# Patient Record
Sex: Female | Born: 1965 | Race: Black or African American | Hispanic: No | Marital: Single | Smoking: Former smoker
Health system: Southern US, Community
[De-identification: ages and names within clinical notes are randomized; demographics above are authoritative.]

## PROBLEM LIST (undated history)

## (undated) DIAGNOSIS — E119 Type 2 diabetes mellitus without complications: Secondary | ICD-10-CM

## (undated) DIAGNOSIS — I10 Essential (primary) hypertension: Secondary | ICD-10-CM

## (undated) DIAGNOSIS — G4730 Sleep apnea, unspecified: Secondary | ICD-10-CM

## (undated) DIAGNOSIS — I509 Heart failure, unspecified: Secondary | ICD-10-CM

## (undated) HISTORY — PX: OTHER SURGICAL HISTORY: SHX169

---

## 2016-11-05 ENCOUNTER — Other Ambulatory Visit
Admission: RE | Admit: 2016-11-05 | Discharge: 2016-11-05 | Disposition: A | Discharge status: Home / Self Care | Payer: Medicaid Other | Source: Ambulatory Visit | Attending: Student | Admitting: Student

## 2016-11-05 DIAGNOSIS — R159 Full incontinence of feces: Secondary | ICD-10-CM | POA: Insufficient documentation

## 2016-11-05 LAB — GASTROINTESTINAL PANEL BY PCR, STOOL (REPLACES STOOL CULTURE)

## 2016-12-09 ENCOUNTER — Other Ambulatory Visit
Admission: RE | Admit: 2016-12-09 | Discharge: 2016-12-09 | Disposition: A | Discharge status: Home / Self Care | Payer: Medicaid Other | Source: Ambulatory Visit | Attending: Student | Admitting: Student

## 2016-12-09 DIAGNOSIS — R152 Fecal urgency: Secondary | ICD-10-CM | POA: Diagnosis present

## 2016-12-09 DIAGNOSIS — R159 Full incontinence of feces: Secondary | ICD-10-CM | POA: Diagnosis present

## 2016-12-09 LAB — C DIFFICILE QUICK SCREEN W PCR REFLEX
C DIFFICILE (CDIFF) TOXIN: NEGATIVE
C DIFFICLE (CDIFF) ANTIGEN: NEGATIVE
C Diff interpretation: NOT DETECTED

## 2016-12-11 LAB — PANCREATIC ELASTASE, FECAL: PANCREATIC ELASTASE-1, STL: 241 ug Elast./g (ref >200)

## 2017-02-11 ENCOUNTER — Ambulatory Visit: Payer: Medicaid Other | Admitting: Physical Therapy

## 2017-12-23 ENCOUNTER — Other Ambulatory Visit: Payer: Self-pay

## 2017-12-23 ENCOUNTER — Inpatient Hospital Stay
Admit: 2017-12-23 | Discharge: 2017-12-23 | Disposition: A | Discharge status: Home / Self Care | Payer: Medicaid Other | Attending: Internal Medicine | Admitting: Internal Medicine

## 2017-12-23 ENCOUNTER — Inpatient Hospital Stay
Admission: EM | Admit: 2017-12-23 | Discharge: 2017-12-25 | DRG: 291 | Disposition: A | Discharge status: Home / Self Care | Payer: Medicaid Other | Attending: Specialist | Admitting: Specialist

## 2017-12-23 ENCOUNTER — Emergency Department: Payer: Medicaid Other

## 2017-12-23 ENCOUNTER — Encounter: Payer: Self-pay | Admitting: Emergency Medicine

## 2017-12-23 DIAGNOSIS — I5043 Acute on chronic combined systolic (congestive) and diastolic (congestive) heart failure: Secondary | ICD-10-CM | POA: Diagnosis present

## 2017-12-23 DIAGNOSIS — I509 Heart failure, unspecified: Secondary | ICD-10-CM

## 2017-12-23 DIAGNOSIS — R0902 Hypoxemia: Secondary | ICD-10-CM

## 2017-12-23 DIAGNOSIS — F17220 Nicotine dependence, chewing tobacco, uncomplicated: Secondary | ICD-10-CM | POA: Diagnosis present

## 2017-12-23 DIAGNOSIS — Z6841 Body Mass Index (BMI) 40.0 and over, adult: Secondary | ICD-10-CM

## 2017-12-23 DIAGNOSIS — Z885 Allergy status to narcotic agent status: Secondary | ICD-10-CM

## 2017-12-23 DIAGNOSIS — J101 Influenza due to other identified influenza virus with other respiratory manifestations: Secondary | ICD-10-CM

## 2017-12-23 DIAGNOSIS — Z7984 Long term (current) use of oral hypoglycemic drugs: Secondary | ICD-10-CM

## 2017-12-23 DIAGNOSIS — Z91018 Allergy to other foods: Secondary | ICD-10-CM

## 2017-12-23 DIAGNOSIS — Z880 Allergy status to penicillin: Secondary | ICD-10-CM | POA: Diagnosis not present

## 2017-12-23 DIAGNOSIS — G4730 Sleep apnea, unspecified: Secondary | ICD-10-CM | POA: Diagnosis present

## 2017-12-23 DIAGNOSIS — E876 Hypokalemia: Secondary | ICD-10-CM | POA: Diagnosis present

## 2017-12-23 DIAGNOSIS — Z79899 Other long term (current) drug therapy: Secondary | ICD-10-CM | POA: Diagnosis not present

## 2017-12-23 DIAGNOSIS — Z7982 Long term (current) use of aspirin: Secondary | ICD-10-CM | POA: Diagnosis not present

## 2017-12-23 DIAGNOSIS — Z888 Allergy status to other drugs, medicaments and biological substances status: Secondary | ICD-10-CM

## 2017-12-23 DIAGNOSIS — J9601 Acute respiratory failure with hypoxia: Secondary | ICD-10-CM | POA: Diagnosis present

## 2017-12-23 DIAGNOSIS — E1140 Type 2 diabetes mellitus with diabetic neuropathy, unspecified: Secondary | ICD-10-CM | POA: Diagnosis present

## 2017-12-23 DIAGNOSIS — F329 Major depressive disorder, single episode, unspecified: Secondary | ICD-10-CM | POA: Diagnosis present

## 2017-12-23 DIAGNOSIS — J1000 Influenza due to other identified influenza virus with unspecified type of pneumonia: Secondary | ICD-10-CM | POA: Diagnosis present

## 2017-12-23 DIAGNOSIS — I110 Hypertensive heart disease with heart failure: Principal | ICD-10-CM | POA: Diagnosis present

## 2017-12-23 HISTORY — DX: Sleep apnea, unspecified: G47.30

## 2017-12-23 HISTORY — DX: Heart failure, unspecified: I50.9

## 2017-12-23 HISTORY — DX: Essential (primary) hypertension: I10

## 2017-12-23 HISTORY — DX: Morbid (severe) obesity due to excess calories: E66.01

## 2017-12-23 HISTORY — DX: Type 2 diabetes mellitus without complications: E11.9

## 2017-12-23 LAB — CBC WITH DIFFERENTIAL/PLATELET
BASOS ABS: 0 10*3/uL (ref 0–0.1)
Basophils Relative: 0 %
Eosinophils Absolute: 0 10*3/uL (ref 0–0.7)
Eosinophils Relative: 0 %
HEMATOCRIT: 37.7 % (ref 35.0–47.0)
HEMOGLOBIN: 12.2 g/dL (ref 12.0–16.0)
Lymphocytes Relative: 9 %
Lymphs Abs: 0.6 10*3/uL — ABNORMAL LOW (ref 1.0–3.6)
MCH: 27.3 pg (ref 26.0–34.0)
MCHC: 32.3 g/dL (ref 32.0–36.0)
MCV: 84.5 fL (ref 80.0–100.0)
Monocytes Absolute: 0.6 10*3/uL (ref 0.2–0.9)
Monocytes Relative: 8 %
NEUTROS ABS: 6.1 10*3/uL (ref 1.4–6.5)
NEUTROS PCT: 83 %
Platelets: 310 10*3/uL (ref 150–440)
RBC: 4.46 MIL/uL (ref 3.80–5.20)
RDW: 13.6 % (ref 11.5–14.5)
WBC: 7.4 10*3/uL (ref 3.6–11.0)

## 2017-12-23 LAB — BASIC METABOLIC PANEL
ANION GAP: 10 (ref 5–15)
BUN: 11 mg/dL (ref 6–20)
CHLORIDE: 99 mmol/L — AB (ref 101–111)
CO2: 29 mmol/L (ref 22–32)
Calcium: 8.3 mg/dL — ABNORMAL LOW (ref 8.9–10.3)
Creatinine, Ser: 0.85 mg/dL (ref 0.44–1.00)
GFR calc non Af Amer: >60 mL/min (ref >60)
Glucose, Bld: 149 mg/dL — ABNORMAL HIGH (ref 65–99)
Potassium: 3.1 mmol/L — ABNORMAL LOW (ref 3.5–5.1)
Sodium: 138 mmol/L (ref 135–145)

## 2017-12-23 LAB — GLUCOSE, CAPILLARY
Glucose-Capillary: 115 mg/dL — ABNORMAL HIGH (ref 65–99)
Glucose-Capillary: 105 mg/dL — ABNORMAL HIGH (ref 65–99)

## 2017-12-23 LAB — TROPONIN I

## 2017-12-23 LAB — LACTIC ACID, PLASMA
LACTIC ACID, VENOUS: 1.1 mmol/L (ref 0.5–1.9)
Lactic Acid, Venous: 1.4 mmol/L (ref 0.5–1.9)

## 2017-12-23 LAB — INFLUENZA PANEL BY PCR (TYPE A & B)
INFLAPCR: POSITIVE — AB
Influenza B By PCR: NEGATIVE

## 2017-12-23 LAB — BRAIN NATRIURETIC PEPTIDE: B NATRIURETIC PEPTIDE 5: 116 pg/mL — AB (ref 0.0–100.0)

## 2017-12-23 MED ORDER — TORSEMIDE 20 MG PO TABS
10.0000 mg | ORAL_TABLET | Freq: Every day | ORAL | Status: DC
  Administered 2017-12-23 – 2017-12-24 (×2): 10 mg via ORAL
  Filled 2017-12-23 (×2): qty 1

## 2017-12-23 MED ORDER — METFORMIN HCL 500 MG PO TABS
500.0000 mg | ORAL_TABLET | Freq: Two times a day (BID) | ORAL | Status: DC
  Administered 2017-12-23 – 2017-12-25 (×4): 500 mg via ORAL
  Filled 2017-12-23 (×4): qty 1

## 2017-12-23 MED ORDER — OSELTAMIVIR PHOSPHATE 75 MG PO CAPS
75.0000 mg | ORAL_CAPSULE | Freq: Two times a day (BID) | ORAL | Status: DC
  Administered 2017-12-23 – 2017-12-25 (×4): 75 mg via ORAL
  Filled 2017-12-23 (×5): qty 1

## 2017-12-23 MED ORDER — SODIUM CHLORIDE 0.9 % IV SOLN: 250.0000 mL | INTRAVENOUS | Status: DC | PRN

## 2017-12-23 MED ORDER — OMEGA-3-ACID ETHYL ESTERS 1 G PO CAPS
2.0000 g | ORAL_CAPSULE | Freq: Two times a day (BID) | ORAL | Status: DC
  Administered 2017-12-23 – 2017-12-25 (×4): 2 g via ORAL
  Filled 2017-12-23 (×4): qty 2

## 2017-12-23 MED ORDER — OSELTAMIVIR PHOSPHATE 75 MG PO CAPS
75.0000 mg | ORAL_CAPSULE | Freq: Once | ORAL | Status: AC
  Administered 2017-12-23: 75 mg via ORAL
  Filled 2017-12-23: qty 1

## 2017-12-23 MED ORDER — IPRATROPIUM-ALBUTEROL 0.5-2.5 (3) MG/3ML IN SOLN: 3.0000 mL | RESPIRATORY_TRACT | Status: DC | PRN

## 2017-12-23 MED ORDER — IBUPROFEN 400 MG PO TABS
400.0000 mg | ORAL_TABLET | Freq: Four times a day (QID) | ORAL | Status: DC | PRN
  Administered 2017-12-23: 400 mg via ORAL
  Filled 2017-12-23: qty 1

## 2017-12-23 MED ORDER — POTASSIUM CHLORIDE CRYS ER 20 MEQ PO TBCR
40.0000 meq | EXTENDED_RELEASE_TABLET | Freq: Once | ORAL | Status: AC
  Administered 2017-12-23: 40 meq via ORAL
  Filled 2017-12-23: qty 2

## 2017-12-23 MED ORDER — HYDROCODONE-ACETAMINOPHEN 5-325 MG PO TABS
1.0000 | ORAL_TABLET | ORAL | Status: DC | PRN
  Administered 2017-12-23 – 2017-12-24 (×3): 2 via ORAL
  Filled 2017-12-23 (×4): qty 2

## 2017-12-23 MED ORDER — MAGNESIUM OXIDE 400 (241.3 MG) MG PO TABS
400.0000 mg | ORAL_TABLET | Freq: Two times a day (BID) | ORAL | Status: DC
  Administered 2017-12-23 – 2017-12-25 (×5): 400 mg via ORAL
  Filled 2017-12-23 (×10): qty 1

## 2017-12-23 MED ORDER — IPRATROPIUM-ALBUTEROL 0.5-2.5 (3) MG/3ML IN SOLN
3.0000 mL | RESPIRATORY_TRACT | Status: DC
  Administered 2017-12-23 (×2): 3 mL via RESPIRATORY_TRACT
  Filled 2017-12-23 (×2): qty 3

## 2017-12-23 MED ORDER — IBUPROFEN 100 MG PO CHEW: 200.0000 mg | CHEWABLE_TABLET | Freq: Four times a day (QID) | ORAL | Status: DC | PRN

## 2017-12-23 MED ORDER — GUAIFENESIN ER 600 MG PO TB12
600.0000 mg | ORAL_TABLET | Freq: Two times a day (BID) | ORAL | Status: DC
  Administered 2017-12-23 – 2017-12-24 (×4): 600 mg via ORAL
  Filled 2017-12-23 (×7): qty 1

## 2017-12-23 MED ORDER — IPRATROPIUM-ALBUTEROL 0.5-2.5 (3) MG/3ML IN SOLN
3.0000 mL | Freq: Four times a day (QID) | RESPIRATORY_TRACT | Status: DC
  Administered 2017-12-24 – 2017-12-25 (×5): 3 mL via RESPIRATORY_TRACT
  Filled 2017-12-23 (×5): qty 3

## 2017-12-23 MED ORDER — LISINOPRIL 10 MG PO TABS
20.0000 mg | ORAL_TABLET | Freq: Every day | ORAL | Status: DC
  Administered 2017-12-24 – 2017-12-25 (×2): 20 mg via ORAL
  Filled 2017-12-23 (×3): qty 2

## 2017-12-23 MED ORDER — ASPIRIN EC 81 MG PO TBEC
81.0000 mg | DELAYED_RELEASE_TABLET | Freq: Every day | ORAL | Status: DC
  Administered 2017-12-24: 81 mg via ORAL
  Filled 2017-12-23 (×2): qty 1

## 2017-12-23 MED ORDER — SODIUM CHLORIDE 0.9% FLUSH: 3.0000 mL | INTRAVENOUS | Status: DC | PRN

## 2017-12-23 MED ORDER — ACETAMINOPHEN 325 MG PO TABS: 650.0000 mg | ORAL_TABLET | Freq: Four times a day (QID) | ORAL | Status: DC | PRN

## 2017-12-23 MED ORDER — LORATADINE 10 MG PO TABS
10.0000 mg | ORAL_TABLET | Freq: Every day | ORAL | Status: DC
  Administered 2017-12-23 – 2017-12-25 (×3): 10 mg via ORAL
  Filled 2017-12-23 (×3): qty 1

## 2017-12-23 MED ORDER — SODIUM CHLORIDE 0.9% FLUSH
3.0000 mL | Freq: Two times a day (BID) | INTRAVENOUS | Status: DC
  Administered 2017-12-23 – 2017-12-24 (×4): 3 mL via INTRAVENOUS

## 2017-12-23 MED ORDER — FUROSEMIDE 10 MG/ML IJ SOLN
20.0000 mg | Freq: Two times a day (BID) | INTRAMUSCULAR | Status: DC
  Administered 2017-12-23 – 2017-12-24 (×2): 20 mg via INTRAVENOUS
  Filled 2017-12-23 (×2): qty 2

## 2017-12-23 MED ORDER — SERTRALINE HCL 50 MG PO TABS
100.0000 mg | ORAL_TABLET | Freq: Every day | ORAL | Status: DC
  Administered 2017-12-24 – 2017-12-25 (×2): 100 mg via ORAL
  Filled 2017-12-23 (×3): qty 2

## 2017-12-23 MED ORDER — INSULIN ASPART 100 UNIT/ML ~~LOC~~ SOLN: 0.0000 [IU] | Freq: Three times a day (TID) | SUBCUTANEOUS | Status: DC

## 2017-12-23 MED ORDER — ENOXAPARIN SODIUM 40 MG/0.4ML ~~LOC~~ SOLN
40.0000 mg | SUBCUTANEOUS | Status: DC
  Administered 2017-12-23 – 2017-12-24 (×2): 40 mg via SUBCUTANEOUS
  Filled 2017-12-23 (×2): qty 0.4

## 2017-12-23 MED ORDER — METOPROLOL SUCCINATE ER 50 MG PO TB24
25.0000 mg | ORAL_TABLET | Freq: Every day | ORAL | Status: DC
  Administered 2017-12-23 – 2017-12-25 (×3): 25 mg via ORAL
  Filled 2017-12-23 (×3): qty 1

## 2017-12-23 MED ORDER — GABAPENTIN 300 MG PO CAPS
300.0000 mg | ORAL_CAPSULE | Freq: Three times a day (TID) | ORAL | Status: DC
  Administered 2017-12-23 – 2017-12-25 (×5): 300 mg via ORAL
  Filled 2017-12-23 (×5): qty 1

## 2017-12-23 MED ORDER — ONDANSETRON HCL 4 MG/2ML IJ SOLN: 4.0000 mg | Freq: Four times a day (QID) | INTRAMUSCULAR | Status: DC | PRN

## 2017-12-23 MED ORDER — ACETAMINOPHEN 500 MG PO TABS
1000.0000 mg | ORAL_TABLET | Freq: Once | ORAL | Status: AC
  Administered 2017-12-23: 1000 mg via ORAL
  Filled 2017-12-23: qty 2

## 2017-12-23 MED ORDER — NICOTINE 21 MG/24HR TD PT24
21.0000 mg | MEDICATED_PATCH | Freq: Every day | TRANSDERMAL | Status: DC
  Administered 2017-12-24: 21 mg via TRANSDERMAL
  Filled 2017-12-23 (×2): qty 1

## 2017-12-23 MED ORDER — ACETAMINOPHEN 650 MG RE SUPP: 650.0000 mg | Freq: Four times a day (QID) | RECTAL | Status: DC | PRN

## 2017-12-23 MED ORDER — GUAIFENESIN-DM 100-10 MG/5ML PO SYRP
5.0000 mL | ORAL_SOLUTION | ORAL | Status: DC | PRN
  Administered 2017-12-24 (×2): 5 mL via ORAL
  Filled 2017-12-23 (×3): qty 5

## 2017-12-23 MED ORDER — BUMETANIDE 1 MG PO TABS
1.0000 mg | ORAL_TABLET | Freq: Every day | ORAL | Status: DC
  Administered 2017-12-24: 1 mg via ORAL
  Filled 2017-12-23 (×2): qty 1

## 2017-12-23 MED ORDER — ONDANSETRON HCL 4 MG PO TABS: 4.0000 mg | ORAL_TABLET | Freq: Four times a day (QID) | ORAL | Status: DC | PRN

## 2017-12-23 NOTE — Plan of Care (Signed)
Patient is having night sweats. Patient educated about isolation precautions.

## 2017-12-23 NOTE — ED Triage Notes (Signed)
Ems pt from [facility] for  x2 days difficulty breathing , EMS reports a low sat of 85% RA , O2 added with improved sat to 97%.

## 2017-12-23 NOTE — ED Notes (Signed)
Per Tammy RN ok to send medications to floor with pt

## 2017-12-23 NOTE — ED Provider Notes (Signed)
[facility] Emergency Department Provider Note       Time seen: ----------------------------------------- 10:54 AM on 12/23/2017 -----------------------------------------   I have reviewed the triage vital signs and the nursing notes.  HISTORY   Chief Complaint Respiratory Distress    HPI Terry Noble is a 52 y.o. female with a history of CHF who presents to the ED for hypoxia.  Patient was sent from an outpatient community clinic for hypoxia.  Patient was actually going there for a regular medical checkup today.  She reports she has been having a fever as well as having chills, body aches and cough with vomiting.  She states when she got there they found her oxygen saturations were in the 80s.  She reports having to stay in the hospital for something similar about 4 years ago.  No past medical history on file.  There are no active problems to display for this patient.   Allergies Patient has no allergy information on record.  Social History Social History   Tobacco Use  . Smoking status: Not on file  Substance Use Topics  . Alcohol use: Not on file  . Drug use: Not on file   Review of Systems Constitutional: Positive for fevers, chills, body aches Eyes: Negative for vision changes ENT:  Negative for congestion, sore throat Cardiovascular: Negative for chest pain. Respiratory: Negative for shortness of breath. Gastrointestinal: Negative for abdominal pain, positive for vomiting Musculoskeletal: Positive for diffuse myalgias Skin: Negative for rash. Neurological: Negative for headaches, focal weakness or numbness.  All systems negative/normal/unremarkable except as stated in the HPI  ____________________________________________   PHYSICAL EXAM:  VITAL SIGNS: ED Triage Vitals  Enc Vitals Group     BP      Pulse      Resp      Temp      Temp src      SpO2      Weight      Height      Head Circumference      Peak Flow      Pain  Score      Pain Loc      Pain Edu?      Excl. in GC?     Constitutional: Alert and oriented. Well appearing and in no distress. Eyes: Conjunctivae are normal. Normal extraocular movements. ENT   Head: Normocephalic and atraumatic.   Nose: No congestion/rhinnorhea.   Mouth/Throat: Mucous membranes are moist.   Neck: No stridor. Cardiovascular: Normal rate, regular rhythm. No murmurs, rubs, or gallops. Respiratory: Normal respiratory effort without tachypnea nor retractions. Breath sounds are clear and equal bilaterally.  Scattered rhonchi Gastrointestinal: Soft and nontender. Normal bowel sounds Musculoskeletal: Nontender with normal range of motion in extremities. No lower extremity tenderness nor edema. Neurologic:  Normal speech and language. No gross focal neurologic deficits are appreciated.  Skin:  Skin is warm, dry and intact. No rash noted. Psychiatric: Mood and affect are normal. Speech and behavior are normal.  ____________________________________________  ED COURSE:  As part of my medical decision making, I reviewed the following data within the electronic MEDICAL RECORD NUMBER History obtained from family if available, nursing notes, old chart and ekg, as well as notes from prior ED visits. Patient presented for flulike symptoms and hypoxia, we will assess with labs and imaging as indicated at this time.   Procedures   EKG: Interpreted by me, sinus tachycardia with a rate of 101 bpm, normal PR interval, normal QRS, normal QT. ____________________________________________  LABS (pertinent positives/negatives)  Labs Reviewed  CBC WITH DIFFERENTIAL/PLATELET - Abnormal; Notable for the following components:      Result Value   Lymphs Abs 0.6 (*)    All other components within normal limits  BASIC METABOLIC PANEL - Abnormal; Notable for the following components:   Potassium 3.1 (*)    Chloride 99 (*)    Glucose, Bld 149 (*)    Calcium 8.3 (*)    All other  components within normal limits  BRAIN NATRIURETIC PEPTIDE - Abnormal; Notable for the following components:   B Natriuretic Peptide 116.0 (*)    All other components within normal limits  INFLUENZA PANEL BY PCR (TYPE A & B) - Abnormal; Notable for the following components:   Influenza A By PCR POSITIVE (*)    All other components within normal limits  CULTURE, BLOOD (ROUTINE X 2)  CULTURE, BLOOD (ROUTINE X 2)  TROPONIN I  LACTIC ACID, PLASMA  LACTIC ACID, PLASMA    RADIOLOGY Images were viewed by me  Chest x-ray  IMPRESSION: There is no pneumonia. Mild prominence of the central pulmonary vascularity and the cardiac silhouette is likely due to the lordotic positioning. One cannot exclude low-grade CHF in the appropriate clinical setting however. ____________________________________________  DIFFERENTIAL DIAGNOSIS   Influenza, pneumonia, URI, bronchitis, CHF, PE, pneumothorax  FINAL ASSESSMENT AND PLAN  Hypoxia, influenza   Plan: Patient had presented for flulike symptoms and was noted to be hypoxic. Patient's labs did reveal influenza a positive results. Patient's imaging did not reveal any pneumonia.  She does have some evidence of CHF which she has a history of.  Off of oxygen she desaturates into the 80s.  I have started her on Tamiflu with supplemental oxygen and a dose of torsemide.  She is stable for admission at this time.   Ulice Dash, MD   Note: This note was generated in part or whole with voice recognition software. Voice recognition is usually quite accurate but there are transcription errors that can and very often do occur. I apologize for any typographical errors that were not detected and corrected.     Emily Filbert, MD 12/23/17 [phone]

## 2017-12-23 NOTE — Progress Notes (Signed)
Pt is still febrile after treatment with tylenol. MD notified. Motrin order received. I will continue to assess.

## 2017-12-23 NOTE — ED Notes (Signed)
2nd set of Blood cultures sent to lab, per Morrell Riddle ok to send with chart labels

## 2017-12-23 NOTE — H&P (Signed)
[facility] [place] [facility]   PATIENT NAME: Terry Noble    MR#:  [id number]  DATE OF BIRTH:  Jun 15, 1966  DATE OF ADMISSION:  12/23/2017  PRIMARY CARE PHYSICIAN: [facility]   REQUESTING/REFERRING PHYSICIAN: Trixie Deis MD   CHIEF COMPLAINT:   Chief Complaint  Patient presents with  . Respiratory Distress    HISTORY OF PRESENT ILLNESS: Terry Noble  is a 52 y.o. female with a known history of congestive heart failure, diabetes type 2, essential hypertension and morbid obesity and sleep apnea but uses CPAP intermittently presents with complaint of shortness of breath body aches and fever.  Patient states that she was not feeling good therefore went to see her primary care provider and was short of breath and was noted to be hypoxic therefore sent to the ER.  In the emergency room she is noted to have some congestive heart failure on chest x-ray as well as the flu.  Patient complains of fevers chills productive cough of clear sputum and body aches.  She denies any nausea vomiting or diarrhea.  She also complains of swelling of the lower extremity. PAST MEDICAL HISTORY:   Past Medical History:  Diagnosis Date  . CHF (congestive heart failure) (HCC)   . Diabetes mellitus without complication (HCC)   . Hypertension   . Morbid obesity (HCC)   . Sleep apnea     PAST SURGICAL HISTORY:  Past Surgical History:  Procedure Laterality Date  . none      SOCIAL HISTORY:  Social History   Tobacco Use  . Smoking status: Former Games developer  . Smokeless tobacco: Current User    Types: Chew  Substance Use Topics  . Alcohol use: No    Frequency: Never    FAMILY HISTORY:  Family History  Problem Relation Age of Onset  . Hypertension Mother     DRUG ALLERGIES:  Allergies  Allergen Reactions  . Cherry   . Lasix [Furosemide]   . Oxycodone   . Penicillins     REVIEW OF SYSTEMS:   CONSTITUTIONAL: Positive fever, positive fatigue or  positive weakness.  EYES: No blurred or double vision.  EARS, NOSE, AND THROAT: No tinnitus or ear pain.  RESPIRATORY: Positive cough, positive shortness of breath, positive wheezing or hemoptysis.  CARDIOVASCULAR: No chest pain, orthopnea, positive edema.  GASTROINTESTINAL: No nausea, vomiting, diarrhea or abdominal pain.  GENITOURINARY: No dysuria, hematuria.  ENDOCRINE: No polyuria, nocturia,  HEMATOLOGY: No anemia, easy bruising or bleeding SKIN: No rash or lesion. MUSCULOSKELETAL: No joint pain or arthritis.   NEUROLOGIC: No tingling, numbness, weakness.  PSYCHIATRY: No anxiety or depression.   MEDICATIONS AT HOME:  Prior to Admission medications   Medication Sig Start Date End Date Taking? Authorizing Provider  acetaminophen (TYLENOL) 500 MG tablet Take 500 mg by mouth every 6 (six) hours as needed.   Yes [provider]  aspirin EC 81 MG tablet Take 81 mg by mouth daily.   Yes [provider]  bumetanide (BUMEX) 1 MG tablet Take 1 mg by mouth daily.   Yes [provider]  EPINEPHrine (EPIPEN 2-PAK) 0.3 mg/0.3 mL IJ SOAJ injection Inject 0.3 mg into the muscle once.   Yes [provider]  gabapentin (NEURONTIN) 300 MG capsule Take 300 mg by mouth 3 (three) times daily.   Yes [provider]  hydrocortisone 2.5 % cream Apply 1 application topically 2 (two) times daily.   Yes [provider]  lisinopril (  PRINIVIL,ZESTRIL) 20 MG tablet Take 20 mg by mouth daily.   Yes [provider]  loratadine (CLARITIN) 10 MG tablet Take 10 mg by mouth daily.   Yes [provider]  magnesium oxide (MAG-OX) 400 MG tablet Take 400 mg by mouth 2 (two) times daily.   Yes [provider]  metFORMIN (GLUCOPHAGE) 500 MG tablet Take 500 mg by mouth 2 (two) times daily with a meal.   Yes [provider]  metoprolol succinate (TOPROL-XL) 25 MG 24 hr tablet Take 25 mg by mouth daily.   Yes [provider]   Omega-3 Fatty Acids (FISH OIL) 1000 MG CAPS Take 1 capsule by mouth 2 (two) times daily.   Yes [provider]  sertraline (ZOLOFT) 100 MG tablet Take 100 mg by mouth daily.   Yes [provider]      PHYSICAL EXAMINATION:   VITAL SIGNS: Blood pressure (!) 176/89, pulse (!) 107, temperature (!) 103 F (39.4 C), temperature source Axillary, resp. rate (!) 25, height 5\' 5"  (1.651 m), weight 297 lb (134.7 kg), last menstrual period 09/23/2017, SpO2 99 %.  GENERAL:  52 y.o.-year-old patient lying in the bed with no acute distress.  EYES: Pupils equal, round, reactive to light and accommodation. No scleral icterus. Extraocular muscles intact.  HEENT: Head atraumatic, normocephalic. Oropharynx and nasopharynx clear.  NECK:  Supple, no jugular venous distention. No thyroid enlargement, no tenderness.  LUNGS: Occasional crackles and some wheezing noted bilaterally CARDIOVASCULAR: S1, S2 normal. No murmurs, rubs, or gallops.  ABDOMEN: Soft, nontender, nondistended. Bowel sounds present. No organomegaly or mass.  EXTREMITIES: No pedal edema, cyanosis, or clubbing.  NEUROLOGIC: Cranial nerves II through XII are intact. Muscle strength 5/5 in all extremities. Sensation intact. Gait not checked.  PSYCHIATRIC: The patient is alert and oriented x 3.  SKIN: No obvious rash, lesion, or ulcer.   LABORATORY PANEL:   CBC Recent Labs  Lab 12/23/17 1110  WBC 7.4  HGB 12.2  HCT 37.7  PLT 310  MCV 84.5  MCH 27.3  MCHC 32.3  RDW 13.6  LYMPHSABS 0.6*  MONOABS 0.6  EOSABS 0.0  BASOSABS 0.0   ------------------------------------------------------------------------------------------------------------------  Chemistries  Recent Labs  Lab 12/23/17 1110  NA 138  K 3.1*  CL 99*  CO2 29  GLUCOSE 149*  BUN 11  CREATININE 0.85  CALCIUM 8.3*   ------------------------------------------------------------------------------------------------------------------ estimated creatinine  clearance is 110.1 mL/min (by C-G formula based on SCr of 0.85 mg/dL). ------------------------------------------------------------------------------------------------------------------ No results for input(s): TSH, T4TOTAL, T3FREE, THYROIDAB in the last 72 hours.  Invalid input(s): FREET3   Coagulation profile No results for input(s): INR, PROTIME in the last 168 hours. ------------------------------------------------------------------------------------------------------------------- No results for input(s): DDIMER in the last 72 hours. -------------------------------------------------------------------------------------------------------------------  Cardiac Enzymes Recent Labs  Lab 12/23/17 1110  TROPONINI <0.03   ------------------------------------------------------------------------------------------------------------------ Invalid input(s): POCBNP  ---------------------------------------------------------------------------------------------------------------  Urinalysis No results found for: COLORURINE, APPEARANCEUR, LABSPEC, PHURINE, GLUCOSEU, HGBUR, BILIRUBINUR, KETONESUR, PROTEINUR, UROBILINOGEN, NITRITE, LEUKOCYTESUR   RADIOLOGY: Dg Chest 2 View  Result Date: 12/23/2017 CLINICAL DATA:  Two days of nonproductive cough.  History of CHF. EXAM: CHEST  2 VIEW COMPARISON:  None FINDINGS: Today's exam is performed in a lordotic fashion. The lungs are well-expanded. There is no focal infiltrate. There is no pleural effusion. The heart is top-normal in size. The pulmonary vascularity is not engorged. There is mild multilevel degenerative disc disease of the thoracic spine. IMPRESSION: There is no pneumonia. Mild prominence of the central pulmonary vascularity and the cardiac silhouette  is likely due to the lordotic positioning. One cannot exclude low-grade CHF in the appropriate clinical setting however. Electronically Signed   By: David  [place] M.D.   On: 12/23/2017 11:43     EKG: Orders placed or performed during the hospital encounter of 12/23/17  . ED EKG  . ED EKG  . EKG 12-Lead  . EKG 12-Lead    IMPRESSION AND PLAN: Patient is a 52 year old African-American female with history of congestive heart failure presenting with shortness of breath and a flu  1.  Acute congestive heart failure type unknown Treat her with low-dose IV Lasix Obtain echocardiogram of the heart Continue Bumex  2.  Influenza a We will treat with Tamiflu Mucolytic's Use as needed nebs due to occasional wheezing  3.  Hypokalemia replace potassium  4.  Diabetes type 2 placed on sliding scale insulin continue metformin  5.  Essential hypertension continue lisinopril  6.  Neuropathy continue gabapentin  7.  Sleep apnea encourage CPAP compliance  8.  Tobacco abuse now patient's chewing tobacco recommend that she stop smoking 4 minutes spent, patient will be started on nicotine patch  9.  Miscellaneous Lovenox for DVT prophylaxis   All the records are reviewed and case discussed with ED provider. Management plans discussed with the patient, family and they are in agreement.  CODE STATUS: Code Status History    This patient does not have a recorded code status. Please follow your organizational policy for patients in this situation.       TOTAL TIME TAKING CARE OF THIS PATIENT: .    Auburn Bilberry M.D on 12/23/2017 at 1:43 PM  Between 7am to 6pm - Pager - [phone]  After 6pm go to [email or web address] - password [email or web address] [facility]  [facility]  Office  [phone]  CC: Primary care physician; [facility]

## 2017-12-24 LAB — ECHOCARDIOGRAM COMPLETE
Height: 65 in
Weight: 4752 oz

## 2017-12-24 LAB — BASIC METABOLIC PANEL
ANION GAP: 9 (ref 5–15)
BUN: 18 mg/dL (ref 6–20)
CO2: 31 mmol/L (ref 22–32)
Calcium: 7.9 mg/dL — ABNORMAL LOW (ref 8.9–10.3)
Chloride: 99 mmol/L — ABNORMAL LOW (ref 101–111)
Creatinine, Ser: 0.91 mg/dL (ref 0.44–1.00)
GFR calc non Af Amer: >60 mL/min (ref >60)
GLUCOSE: 124 mg/dL — AB (ref 65–99)
POTASSIUM: 3.3 mmol/L — AB (ref 3.5–5.1)
SODIUM: 139 mmol/L (ref 135–145)

## 2017-12-24 LAB — GLUCOSE, CAPILLARY
GLUCOSE-CAPILLARY: 98 mg/dL (ref 65–99)
GLUCOSE-CAPILLARY: 72 mg/dL (ref 65–99)
GLUCOSE-CAPILLARY: 101 mg/dL — AB (ref 65–99)
GLUCOSE-CAPILLARY: 100 mg/dL — AB (ref 65–99)

## 2017-12-24 LAB — CBC
HCT: 36.9 % (ref 35.0–47.0)
Hemoglobin: 11.7 g/dL — ABNORMAL LOW (ref 12.0–16.0)
MCH: 27 pg (ref 26.0–34.0)
MCHC: 31.7 g/dL — AB (ref 32.0–36.0)
MCV: 85.3 fL (ref 80.0–100.0)
PLATELETS: 267 10*3/uL (ref 150–440)
RBC: 4.32 MIL/uL (ref 3.80–5.20)
RDW: 14.4 % (ref 11.5–14.5)
WBC: 5.7 10*3/uL (ref 3.6–11.0)

## 2017-12-24 LAB — HIV ANTIBODY (ROUTINE TESTING W REFLEX): HIV SCREEN 4TH GENERATION: NONREACTIVE

## 2017-12-24 MED ORDER — FUROSEMIDE 10 MG/ML IJ SOLN
20.0000 mg | Freq: Two times a day (BID) | INTRAMUSCULAR | Status: DC
  Administered 2017-12-24 (×2): 20 mg via INTRAVENOUS
  Filled 2017-12-24 (×2): qty 2

## 2017-12-24 NOTE — Progress Notes (Signed)
Provided patient with "Living Better with Heart Failure" packet. Briefly reviewed definition of heart failure and signs and symptoms of an exacerbation. Reviewed importance of and reason behind checking weight daily in the AM, after using the bathroom, but before getting dressed. Discussed when to call the Dr= weight gain of >2lb overnight of 5lb in a week,  Discussed yellow zone= call MD: weight gain of >2lb overnight of 5lb in a week, increased swelling, increased SOB when lying down, chest discomfort, dizziness, increased fatigue Red Zone= call 911: struggle to breath, fainting or near fainting, significant chest pain Reviewed low sodium diet <2g/day-provided handout of recommended and not recommended foods  Fluid restriction <2L/day Reviewed how to read nutrition label Reviewed medication changes: Explained briefly why pt is on the medications (either make you feel better, live longer or keep you out of the hospital) and discussed monitoring and side effects  Discussed tobacco cessation: non-smoker, uses chewing tobacco, discussed options to quit Discussed exercise: as tolerated  Patient is very knowledgeable about her disease state and seems very motivated. Unfortunately, she has not been able to afford her medications due to lack of Medicaid. Will discuss with Care Mgmt.   Luisa Hart, PharmD Clinical Pharmacist

## 2017-12-24 NOTE — Progress Notes (Signed)
[facility] - [place] [facility]   PATIENT NAME: Terry Noble    MR#:  [id number]  DATE OF BIRTH:  [birth year]  SUBJECTIVE:   Patient here due to acute respiratory failure with hypoxia secondary to flu and also noted to be in mild CHF. Shortness of breath has improved, but on ambulation patient became quite short of breath and hypoxic. Patient admits to a cough which is nonproductive, no fever overnight.  REVIEW OF SYSTEMS:    Review of Systems  Constitutional: Negative for chills and fever.  HENT: Negative for congestion and tinnitus.   Eyes: Negative for blurred vision and double vision.  Respiratory: Positive for cough and shortness of breath. Negative for wheezing.   Cardiovascular: Negative for chest pain, orthopnea and PND.  Gastrointestinal: Negative for abdominal pain, diarrhea, nausea and vomiting.  Genitourinary: Negative for dysuria and hematuria.  Neurological: Negative for dizziness, sensory change and focal weakness.  All other systems reviewed and are negative.   Nutrition: Heart healthy Tolerating Diet: yes Tolerating PT: Ambulatory  DRUG ALLERGIES:   Allergies  Allergen Reactions  . Cherry   . Lasix [Furosemide]   . Oxycodone   . Penicillins     VITALS:  Blood pressure 128/64, pulse 83, temperature 98.2 F (36.8 C), resp. rate 18, height 5\' 5"  (1.651 m), weight 130.3 kg (287 lb 3.2 oz), last menstrual period 09/23/2017, SpO2 95 %.  PHYSICAL EXAMINATION:   Physical Exam  GENERAL:  52 y.o.-year-old obese patient lying in bed in no acute distress.  EYES: Pupils equal, round, reactive to light and accommodation. No scleral icterus. Extraocular muscles intact.  HEENT: Head atraumatic, normocephalic. Oropharynx and nasopharynx clear.  NECK:  Supple, no jugular venous distention. No thyroid enlargement, no tenderness.  LUNGS: Normal breath sounds bilaterally, no wheezing, rales, rhonchi. No use of accessory muscles of respiration.   CARDIOVASCULAR: S1, S2 normal. No murmurs, rubs, or gallops.  ABDOMEN: Soft, nontender, nondistended. Bowel sounds present. No organomegaly or mass.  EXTREMITIES: No cyanosis, clubbing, +1 edema b/l NEUROLOGIC: Cranial nerves II through XII are intact. No focal Motor or sensory deficits b/l.   PSYCHIATRIC: The patient is alert and oriented x 3.  SKIN: No obvious rash, lesion, or ulcer.    LABORATORY PANEL:   CBC Recent Labs  Lab 12/24/17 0451  WBC 5.7  HGB 11.7*  HCT 36.9  PLT 267   ------------------------------------------------------------------------------------------------------------------  Chemistries  Recent Labs  Lab 12/24/17 0451  NA 139  K 3.3*  CL 99*  CO2 31  GLUCOSE 124*  BUN 18  CREATININE 0.91  CALCIUM 7.9*   ------------------------------------------------------------------------------------------------------------------  Cardiac Enzymes Recent Labs  Lab 12/23/17 1110  TROPONINI <0.03   ------------------------------------------------------------------------------------------------------------------  RADIOLOGY:  Dg Chest 2 View  Result Date: 12/23/2017 CLINICAL DATA:  Two days of nonproductive cough.  History of CHF. EXAM: CHEST  2 VIEW COMPARISON:  None FINDINGS: Today's exam is performed in a lordotic fashion. The lungs are well-expanded. There is no focal infiltrate. There is no pleural effusion. The heart is top-normal in size. The pulmonary vascularity is not engorged. There is mild multilevel degenerative disc disease of the thoracic spine. IMPRESSION: There is no pneumonia. Mild prominence of the central pulmonary vascularity and the cardiac silhouette is likely due to the lordotic positioning. One cannot exclude low-grade CHF in the appropriate clinical setting however. Electronically Signed   By: David  12/25/2017 M.D.   On: 12/23/2017 11:43     ASSESSMENT AND PLAN:   52 year old female with  past medical history of diabetes, history of CHF,  sleep apnea, essential hypertension who presented to the hospital due to shortness of breath noted to be hypoxic.  1. Acute respiratory failure with hypoxia-secondary to flu combined with underlying mild CHF. -Continue O2 supplementation, continue diuresis with IV Lasix, continue Tamiflu. Patient likely will need home oxygen upon discharge.  2. CHF-unclear this is acute systolic or diastolic. Await echocardiogram results. -Continue diuresis with IV Lasix, continue metoprolol, lisinopril. -Follow I's and O's and daily weights.  3. Flu-patient is positive for influenza A. Continue supportive care. Continue Tamiflu.  4. Tobacco abuse-continue nicotine patch.  5. Diabetes type 2 without complication-continue metformin, sliding scale insulin. Follow blood sugars.  6. Depression-continue Zoloft.  7. Diabetic neuropathy-continue gabapentin.   All the records are reviewed and case discussed with Care Management/Social Worker. Management plans discussed with the patient, family and they are in agreement.  CODE STATUS: Full code  DVT Prophylaxis: Lovenox  TOTAL TIME TAKING CARE OF THIS PATIENT: 30 minutes.   POSSIBLE D/C IN 1-2 DAYS, DEPENDING ON CLINICAL CONDITION.   Houston Siren M.D on 12/24/2017 at 2:23 PM  Between 7am to 6pm - Pager - [phone]  After 6pm go to [email or web address] - Therapist, [facility]  Office  [phone]  CC: Primary care physician; [facility]

## 2017-12-24 NOTE — Progress Notes (Signed)
SATURATION QUALIFICATIONS: (This note is used to comply with regulatory documentation for home oxygen)  Patient Saturations on Room Air at Rest = 94%  Patient Saturations on Room Air while Ambulating = 85%  Patient Saturations on 2 Liters of oxygen while Ambulating = 95%  Please briefly explain why patient needs home oxygen: 

## 2017-12-25 LAB — GLUCOSE, CAPILLARY: Glucose-Capillary: 100 mg/dL — ABNORMAL HIGH (ref 65–99)

## 2017-12-25 LAB — BASIC METABOLIC PANEL
Anion gap: 10 (ref 5–15)
BUN: 25 mg/dL — ABNORMAL HIGH (ref 6–20)
CHLORIDE: 95 mmol/L — AB (ref 101–111)
CO2: 33 mmol/L — AB (ref 22–32)
CREATININE: 0.97 mg/dL (ref 0.44–1.00)
Calcium: 7.9 mg/dL — ABNORMAL LOW (ref 8.9–10.3)
GFR calc non Af Amer: >60 mL/min (ref >60)
GLUCOSE: 111 mg/dL — AB (ref 65–99)
Potassium: 3.1 mmol/L — ABNORMAL LOW (ref 3.5–5.1)
Sodium: 138 mmol/L (ref 135–145)

## 2017-12-25 MED ORDER — TORSEMIDE 20 MG PO TABS: 20.0000 mg | ORAL_TABLET | Freq: Every day | ORAL | 1 refills | Status: DC

## 2017-12-25 MED ORDER — OSELTAMIVIR PHOSPHATE 75 MG PO CAPS: 75.0000 mg | ORAL_CAPSULE | Freq: Two times a day (BID) | ORAL | 0 refills | Status: AC

## 2017-12-25 MED ORDER — POTASSIUM CHLORIDE ER 10 MEQ PO TBCR: 10.0000 meq | EXTENDED_RELEASE_TABLET | Freq: Every day | ORAL | 1 refills | Status: DC

## 2017-12-25 NOTE — Progress Notes (Signed)
Pt to be discharged to home today. Iv and tele removed. disch instructions and prescriips given to pt. disch via w.c. Daughter to transport.

## 2017-12-25 NOTE — Progress Notes (Signed)
Pt ambulated twice around nurses' station on room air. sats remained at 94%. Heart rate 80's-90's. Pace was brisk. Pt tolerated this well.

## 2017-12-25 NOTE — Progress Notes (Signed)
[facility]    Terry Noble was admitted to the Hospital on 12/23/2017 and Discharged  12/25/2017 and should be excused from work/school   for 3 days starting 12/23/2017 , may return to work/school without any restrictions.  Call Hilda Lias MD, [facility]  [phone] with questions.  Houston Siren M.D on 12/25/2017,at 9:21 AM

## 2017-12-28 LAB — CULTURE, BLOOD (ROUTINE X 2)
CULTURE: NO GROWTH
Culture: NO GROWTH
Special Requests: ADEQUATE

## 2017-12-29 ENCOUNTER — Telehealth: Payer: Self-pay

## 2017-12-29 NOTE — Telephone Encounter (Signed)
EMMI follow-up: This pt. Left me a VM after receiving an automated called. She is on the future patient call list. I returned her call and let her know we had a new process in place to follow-up with patients if they had any discharge concerns. No concerns at this time.

## 2017-12-31 ENCOUNTER — Ambulatory Visit: Payer: Self-pay | Admitting: Family

## 2018-01-01 ENCOUNTER — Telehealth: Payer: Self-pay

## 2018-01-01 NOTE — Telephone Encounter (Signed)
Flagged on EMMI report for not reading discharge papers.  Called and spoke with patient.  She reports she has read them and has no questions or concerns at this time.

## 2018-01-04 NOTE — Discharge Summary (Signed)
[employer] - [address] at [facility]   PATIENT NAME: Terry Noble    MR#:  [id number]  DATE OF BIRTH:  12/20/1965  DATE OF ADMISSION:  12/23/2017 ADMITTING PHYSICIAN: Auburn Bilberry, MD  DATE OF DISCHARGE: 12/25/2017 11:32 AM  PRIMARY CARE PHYSICIAN: Delma Freeze, FNP    ADMISSION DIAGNOSIS:  Influenza A [J10.1] Hypoxia [R09.02] Congestive heart failure, unspecified HF chronicity, unspecified heart failure type (HCC) [I50.9]  DISCHARGE DIAGNOSIS:  Active Problems:   CHF (congestive heart failure) (HCC)   SECONDARY DIAGNOSIS:   Past Medical History:  Diagnosis Date  . CHF (congestive heart failure) (HCC)   . Diabetes mellitus without complication (HCC)   . Hypertension   . Morbid obesity (HCC)   . Sleep apnea     HOSPITAL COURSE:   52 year old female with past medical history of diabetes, history of CHF, sleep apnea, essential hypertension who presented to the hospital due to shortness of breath noted to be hypoxic.  1. Acute respiratory failure with hypoxia-secondary to flu combined with underlying mild CHF. -patient was admitted to the hospital placed on oxygen, given IV Lasix, placed on Tamiflu. -She has improved but does require home oxygen and therefore that is being arranged for her prior to discharge.  2. CHF-this was acute on chronic diastolic dysfunction. Patient's echocardiogram showed normal ejection fraction of 55% -patient was diuresed with IV Lasix and has clinically improved. Patient will resume her torsemide upon discharge along with her metoprolol and lisinopril.  3. Flu-patient was positive for influenza A. C - improved and will finish her Tamiflu.   4. Tobacco abuse- while in the hospital pt. Was on nicotine patch.   5. Diabetes type 2 without complication-while in the hospital patient was on sliding scale insulin, now being discharged on her metformin.  6. Depression- pt. Will continue Zoloft.  7. Diabetic neuropathy- pt.  Will continue gabapentin.    DISCHARGE CONDITIONS:   Stable  CONSULTS OBTAINED:    DRUG ALLERGIES:   Allergies  Allergen Reactions  . Cherry   . Lasix [Furosemide]   . Oxycodone   . Penicillins     DISCHARGE MEDICATIONS:   Allergies as of 12/25/2017      Reactions   Cherry    Lasix [furosemide]    Oxycodone    Penicillins       Medication List    TAKE these medications   acetaminophen 500 MG tablet Commonly known as:  TYLENOL Take 500 mg by mouth every 6 (six) hours as needed.   aspirin EC 81 MG tablet Take 81 mg by mouth daily.   bumetanide 1 MG tablet Commonly known as:  BUMEX Take 1 mg by mouth daily.   EPIPEN 2-PAK 0.3 mg/0.3 mL Soaj injection Generic drug:  EPINEPHrine Inject 0.3 mg into the muscle once.   Fish Oil 1000 MG Caps Take 1 capsule by mouth 2 (two) times daily.   gabapentin 300 MG capsule Commonly known as:  NEURONTIN Take 300 mg by mouth 3 (three) times daily.   hydrocortisone 2.5 % cream Apply 1 application topically 2 (two) times daily.   lisinopril 20 MG tablet Commonly known as:  PRINIVIL,ZESTRIL Take 20 mg by mouth daily.   loratadine 10 MG tablet Commonly known as:  CLARITIN Take 10 mg by mouth daily.   magnesium oxide 400 MG tablet Commonly known as:  MAG-OX Take 400 mg by mouth 2 (two) times daily.   metFORMIN 500 MG tablet Commonly known as:  GLUCOPHAGE Take 500  mg by mouth 2 (two) times daily with a meal.   metoprolol succinate 25 MG 24 hr tablet Commonly known as:  TOPROL-XL Take 25 mg by mouth daily.   potassium chloride 10 MEQ tablet Commonly known as:  K-DUR Take 1 tablet (10 mEq total) by mouth daily.   sertraline 100 MG tablet Commonly known as:  ZOLOFT Take 100 mg by mouth daily.   torsemide 20 MG tablet Commonly known as:  DEMADEX Take 1 tablet (20 mg total) by mouth daily.     ASK your doctor about these medications   oseltamivir 75 MG capsule Commonly known as:  TAMIFLU Take 1 capsule (75  mg total) by mouth 2 (two) times daily for 3 days. Ask about: Should I take this medication?         DISCHARGE INSTRUCTIONS:   DIET:  Cardiac diet and Diabetic diet  DISCHARGE CONDITION:  Stable  ACTIVITY:  Activity as tolerated  OXYGEN:  Home Oxygen: Yes.     Oxygen Delivery: 2 liters/min via Patient connected to nasal cannula oxygen  DISCHARGE LOCATION:  home   If you experience worsening of your admission symptoms, develop shortness of breath, life threatening emergency, suicidal or homicidal thoughts you must seek medical attention immediately by calling 911 or calling your MD immediately  if symptoms less severe.  You Must read complete instructions/literature along with all the possible adverse reactions/side effects for all the Medicines you take and that have been prescribed to you. Take any new Medicines after you have completely understood and accpet all the possible adverse reactions/side effects.   Please note  You were cared for by a hospitalist during your hospital stay. If you have any questions about your discharge medications or the care you received while you were in the hospital after you are discharged, you can call the unit and asked to speak with the hospitalist on call if the hospitalist that took care of you is not available. Once you are discharged, your primary care physician will handle any further medical issues. Please note that NO REFILLS for any discharge medications will be authorized once you are discharged, as it is imperative that you return to your primary care physician (or establish a relationship with a primary care physician if you do not have one) for your aftercare needs so that they can reassess your need for medications and monitor your lab values.     Today   Shortness of breath much improved.  Patient wants to go home. She did qualify for oxygen which is being arranged for her.  VITAL SIGNS:  Blood pressure 120/67, pulse 81,  temperature 98.2 F (36.8 C), temperature source Oral, resp. rate (!) 21, height 5\' 5"  (1.651 m), weight 129.4 kg (285 lb 3.2 oz), last menstrual period 09/23/2017, SpO2 94 %.  I/O:  No intake or output data in the 24 hours ending 01/04/18 1519  PHYSICAL EXAMINATION:   GENERAL:  52 y.o.-year-old obese patient lying in bed in no acute distress.  EYES: Pupils equal, round, reactive to light and accommodation. No scleral icterus. Extraocular muscles intact.  HEENT: Head atraumatic, normocephalic. Oropharynx and nasopharynx clear.  NECK:  Supple, no jugular venous distention. No thyroid enlargement, no tenderness.  LUNGS: Normal breath sounds bilaterally, no wheezing, rales, rhonchi. No use of accessory muscles of respiration.  CARDIOVASCULAR: S1, S2 normal. No murmurs, rubs, or gallops.  ABDOMEN: Soft, nontender, nondistended. Bowel sounds present. No organomegaly or mass.  EXTREMITIES: No cyanosis, clubbing, +1 edema b/l  NEUROLOGIC: Cranial nerves II through XII are intact. No focal Motor or sensory deficits b/l.   PSYCHIATRIC: The patient is alert and oriented x 3.  SKIN: No obvious rash, lesion, or ulcer.    DATA REVIEW:   CBC No results for input(s): WBC, HGB, HCT, PLT in the last 168 hours.  Chemistries  No results for input(s): NA, K, CL, CO2, GLUCOSE, BUN, CREATININE, CALCIUM, MG, AST, ALT, ALKPHOS, BILITOT in the last 168 hours.  Invalid input(s): GFRCGP  Cardiac Enzymes No results for input(s): TROPONINI in the last 168 hours.  Microbiology Results  Results for orders placed or performed during the hospital encounter of 12/23/17  Blood culture (routine x 2)     Status: None   Collection Time: 12/23/17 11:10 AM  Result Value Ref Range Status   Specimen Description BLOOD RAC  Final   Special Requests   Final    BOTTLES DRAWN AEROBIC AND ANAEROBIC Blood Culture results may not be optimal due to an excessive volume of blood received in culture bottles   Culture   Final     NO GROWTH 5 DAYS Performed at [facility], [address] [place], [place] [address]    Report Status 12/28/2017 FINAL  Final  Blood culture (routine x 2)     Status: None   Collection Time: 12/23/17 11:59 AM  Result Value Ref Range Status   Specimen Description BLOOD LAC  Final   Special Requests   Final    BOTTLES DRAWN AEROBIC AND ANAEROBIC Blood Culture adequate volume   Culture   Final    NO GROWTH 5 DAYS Performed at [facility], [address], [place], [place] [address]    Report Status 12/28/2017 FINAL  Final    RADIOLOGY:  No results found.    Management plans discussed with the patient, family and they are in agreement.  CODE STATUS:  Code Status History    Date Active Date Inactive Code Status Order ID Comments User Context   12/23/2017 14:17 12/25/2017 14:50 Full Code [id number]  Auburn Bilberry, MD Inpatient      TOTAL TIME TAKING CARE OF THIS PATIENT: 40 minutes.    Houston Siren M.D on 01/04/2018 at 3:19 PM  Between 7am to 6pm - Pager - [phone]  After 6pm go to [email or web address] - Scientist, research (life sciences) [facility]  Office  [phone]  CC: Primary care physician; Delma Freeze, FNP

## 2018-01-08 ENCOUNTER — Encounter: Payer: Self-pay | Admitting: Family

## 2018-01-08 ENCOUNTER — Ambulatory Visit: Payer: Medicaid Other | Attending: Family | Admitting: Family

## 2018-01-08 ENCOUNTER — Other Ambulatory Visit: Payer: Self-pay

## 2018-01-08 VITALS — BP 143/63 | HR 92 | Resp 18 | Ht 65.0 in | Wt 292.4 lb

## 2018-01-08 DIAGNOSIS — Z6841 Body Mass Index (BMI) 40.0 and over, adult: Secondary | ICD-10-CM | POA: Diagnosis not present

## 2018-01-08 DIAGNOSIS — E119 Type 2 diabetes mellitus without complications: Secondary | ICD-10-CM | POA: Insufficient documentation

## 2018-01-08 DIAGNOSIS — G4733 Obstructive sleep apnea (adult) (pediatric): Secondary | ICD-10-CM

## 2018-01-08 DIAGNOSIS — Z888 Allergy status to other drugs, medicaments and biological substances status: Secondary | ICD-10-CM | POA: Insufficient documentation

## 2018-01-08 DIAGNOSIS — Z91018 Allergy to other foods: Secondary | ICD-10-CM | POA: Diagnosis not present

## 2018-01-08 DIAGNOSIS — Z885 Allergy status to narcotic agent status: Secondary | ICD-10-CM | POA: Diagnosis not present

## 2018-01-08 DIAGNOSIS — Z79899 Other long term (current) drug therapy: Secondary | ICD-10-CM | POA: Diagnosis not present

## 2018-01-08 DIAGNOSIS — I5032 Chronic diastolic (congestive) heart failure: Secondary | ICD-10-CM | POA: Diagnosis present

## 2018-01-08 DIAGNOSIS — Z7982 Long term (current) use of aspirin: Secondary | ICD-10-CM | POA: Diagnosis not present

## 2018-01-08 DIAGNOSIS — F17220 Nicotine dependence, chewing tobacco, uncomplicated: Secondary | ICD-10-CM | POA: Insufficient documentation

## 2018-01-08 DIAGNOSIS — Z720 Tobacco use: Secondary | ICD-10-CM

## 2018-01-08 DIAGNOSIS — Z91013 Allergy to seafood: Secondary | ICD-10-CM | POA: Insufficient documentation

## 2018-01-08 DIAGNOSIS — I110 Hypertensive heart disease with heart failure: Secondary | ICD-10-CM | POA: Insufficient documentation

## 2018-01-08 DIAGNOSIS — Z8249 Family history of ischemic heart disease and other diseases of the circulatory system: Secondary | ICD-10-CM | POA: Insufficient documentation

## 2018-01-08 DIAGNOSIS — Z880 Allergy status to penicillin: Secondary | ICD-10-CM | POA: Insufficient documentation

## 2018-01-08 DIAGNOSIS — Z7984 Long term (current) use of oral hypoglycemic drugs: Secondary | ICD-10-CM | POA: Diagnosis not present

## 2018-01-08 DIAGNOSIS — I10 Essential (primary) hypertension: Secondary | ICD-10-CM

## 2018-01-08 LAB — GLUCOSE, CAPILLARY: Glucose-Capillary: 163 mg/dL — ABNORMAL HIGH (ref 65–99)

## 2018-01-08 NOTE — Progress Notes (Signed)
Patient ID: Terry Noble, female    DOB: 12/13/1965, 52 y.o.   MRN: [id number]  HPI  Terry Noble is a 52 y/o female with a history of DM, HTN, obstructive sleep apnea, current tobacco use (chews) and chronic heart failure.   Echo report from 12/23/17 reviewed and showed an EF of 55%.  Admitted 12/23/17 due to influenza and mild heart failure. Initially given IV lasix and then transitioned to oral diuretics. Tamiflu was also given. Discharged with home oxygen after 2 days.   She presents today for her initial visit with a chief complaint of moderate fatigue upon minimal exertion. She describes this as chronic in nature having been present for several months. She has associated cough, headaches and difficulty sleeping. She denies any abdominal distention, palpitations, edema, chest pain, dizziness, shortness of breath or weight gain.   Past Medical History:  Diagnosis Date  . CHF (congestive heart failure) (HCC)   . Diabetes mellitus without complication (HCC)   . Hypertension   . Morbid obesity (HCC)   . Sleep apnea    Past Surgical History:  Procedure Laterality Date  . none     Family History  Problem Relation Age of Onset  . Hypertension Mother    Social History   Tobacco Use  . Smoking status: Former Games developer  . Smokeless tobacco: Current User    Types: Chew  Substance Use Topics  . Alcohol use: No    Frequency: Never   Allergies  Allergen Reactions  . Shellfish Allergy Hives and Shortness Of Breath  . Cherry   . Lasix [Furosemide] Swelling  . Oxycodone   . Penicillins    Prior to Admission medications   Medication Sig Start Date End Date Taking? Authorizing Provider  acetaminophen (TYLENOL) 500 MG tablet Take 500 mg by mouth every 6 (six) hours as needed.   Yes [provider]  aspirin EC 81 MG tablet Take 81 mg by mouth daily.   Yes [provider]  EPINEPHrine (EPIPEN 2-PAK) 0.3 mg/0.3 mL IJ SOAJ injection Inject 0.3 mg into the muscle once.   Yes  [provider]  gabapentin (NEURONTIN) 300 MG capsule Take 300 mg by mouth 3 (three) times daily.   Yes [provider]  hydrocortisone 2.5 % cream Apply 1 application topically 2 (two) times daily.   Yes [provider]  lisinopril (PRINIVIL,ZESTRIL) 20 MG tablet Take 20 mg by mouth daily.   Yes [provider]  loratadine (CLARITIN) 10 MG tablet Take 10 mg by mouth daily.   Yes [provider]  magnesium oxide (MAG-OX) 400 MG tablet Take 400 mg by mouth 2 (two) times daily.   Yes [provider]  metFORMIN (GLUCOPHAGE) 500 MG tablet Take 500 mg by mouth 2 (two) times daily with a meal.   Yes [provider]  metoprolol succinate (TOPROL-XL) 25 MG 24 hr tablet Take 25 mg by mouth daily.   Yes [provider]  Omega-3 Fatty Acids (FISH OIL) 1000 MG CAPS Take 1 capsule by mouth 2 (two) times daily.   Yes [provider]  potassium chloride (K-DUR) 10 MEQ tablet Take 1 tablet (10 mEq total) by mouth daily. 12/25/17 02/23/18 Yes Sainani, Rolly Pancake, MD  sertraline (ZOLOFT) 100 MG tablet Take 100 mg by mouth daily.   Yes [provider]  torsemide (DEMADEX) 20 MG tablet Take 1 tablet (20 mg total) by mouth daily. 12/25/17 02/23/18 Yes Houston Siren, MD   Review of Systems  Constitutional: Positive for fatigue. Negative for appetite change.  HENT: Negative for congestion, postnasal drip and sore throat.   Eyes: Negative.   Respiratory: Positive for cough (dry cough). Negative for chest tightness and shortness of breath.   Cardiovascular: Negative for chest pain, palpitations and leg swelling.  Gastrointestinal: Negative for abdominal distention and abdominal pain.  Endocrine: Negative.   Genitourinary: Negative.   Musculoskeletal: Negative for back pain and neck pain.  Skin: Negative.   Allergic/Immunologic: Negative.   Neurological: Positive for headaches. Negative for dizziness and light-headedness.   Hematological: Negative for adenopathy. Does not bruise/bleed easily.  Psychiatric/Behavioral: Positive for sleep disturbance. Negative for dysphoric mood. The patient is not nervous/anxious.     Vitals:   01/08/18 0900  BP: (!) 143/63  Pulse: 92  Resp: 18  SpO2: 97%  Weight: 292 lb 6 oz (132.6 kg)  Height: 5\' 5"  (1.651 m)   Wt Readings from Last 3 Encounters:  01/08/18 292 lb 6 oz (132.6 kg)  12/25/17 285 lb 3.2 oz (129.4 kg)   Lab Results  Component Value Date   CREATININE 0.97 12/25/2017   CREATININE 0.91 12/24/2017   CREATININE 0.85 12/23/2017    Physical Exam  Constitutional: She is oriented to person, place, and time. She appears well-developed and well-nourished.  HENT:  Head: Normocephalic and atraumatic.  Neck: Normal range of motion. Neck supple. No JVD present.  Cardiovascular: Normal rate and regular rhythm.  Pulmonary/Chest: Effort normal. She has no wheezes. She has no rales.  Abdominal: Soft. She exhibits no distension. There is no tenderness.  Musculoskeletal: She exhibits no edema or tenderness.  Neurological: She is alert and oriented to person, place, and time.  Skin: Skin is warm and dry.  Psychiatric: She has a normal mood and affect. Her behavior is normal. Thought content normal.  Nursing note and vitals reviewed.  Assessment & Plan:  1: Chronic heart failure with preserved ejection fraction- - NYHA class III - euvolemic today - weighing daily and she says that her weight has been stable. Instructed to call for an overnight weight gain of >2 pounds or a weekly weight gain of >5 pounds - not adding salt to her food. Discussed the importance of closely following a 2000mg  sodium diet and written dietary information was given to her about this.  - BNP on 12/23/17 was 116.0  2: DM- - fasting glucose in clinic was 163 - follows with PCP @ [facility] & returns 02/09/18  3: HTN- - BP looks good today - BMP from 12/25/17  reviewed and showed sodium 138, potassium 3.1 and GFR >60  4: Obstructive sleep apnea- - wearing CPAP inconsistently due to electric bill - feels better after she wears it though - wearing it ~ 1/2 hour nightly  - lengthy discussion had with her about the importance of wearing her CPAP nightly for at least 6 hours a night  5: Tobacco use- - + chewing tobacco now - complete cessation discussed for 3 minutes with her  Patient did not bring her medications nor a list. Each medication was verbally reviewed with the patient and she was encouraged to bring the bottles to every visit to confirm accuracy of list.  Return in 2 months or sooner for any questions/problems before then.

## 2018-01-08 NOTE — Patient Instructions (Signed)
Continue weighing daily and call for an overnight weight gain of > 2 pounds or a weekly weight gain of >5 pounds. 

## 2018-01-09 DIAGNOSIS — Z720 Tobacco use: Secondary | ICD-10-CM | POA: Insufficient documentation

## 2018-01-09 DIAGNOSIS — E119 Type 2 diabetes mellitus without complications: Secondary | ICD-10-CM | POA: Insufficient documentation

## 2018-01-09 DIAGNOSIS — I10 Essential (primary) hypertension: Secondary | ICD-10-CM | POA: Insufficient documentation

## 2018-01-09 DIAGNOSIS — G4733 Obstructive sleep apnea (adult) (pediatric): Secondary | ICD-10-CM | POA: Insufficient documentation

## 2018-01-13 ENCOUNTER — Ambulatory Visit: Payer: Self-pay | Admitting: Pharmacy Technician

## 2018-01-13 NOTE — Progress Notes (Signed)
Patient unable to attend eligibility appointment on 01/13/18 at 9:00 due to lack of transportation.  Offered ACTA. Patient declined.  Stated that she would have transportation on 01/27/18.  Rescheduled appt to 01/27/18.  Patient indicated that she had left a message on [phone] indicating that she needed to cancel her eligibility appointment.  No messages from patient were found on [phone].    Discussed with patient the financial information that she needs to bring to her appointment.  Patient verbally acknowledged that she understood.   Also, made appt for MTM.     Sherilyn Dacosta Care Manager [facility]

## 2018-01-27 ENCOUNTER — Other Ambulatory Visit: Payer: Self-pay

## 2018-01-27 ENCOUNTER — Encounter (INDEPENDENT_AMBULATORY_CARE_PROVIDER_SITE_OTHER): Payer: Self-pay

## 2018-01-27 ENCOUNTER — Ambulatory Visit: Payer: Self-pay | Admitting: Pharmacy Technician

## 2018-01-27 ENCOUNTER — Ambulatory Visit: Payer: Self-pay | Admitting: Pharmacist

## 2018-01-27 ENCOUNTER — Encounter: Payer: Self-pay | Admitting: Pharmacist

## 2018-01-27 VITALS — BP 139/79 | Ht 65.0 in | Wt 289.0 lb

## 2018-01-27 DIAGNOSIS — Z79899 Other long term (current) drug therapy: Secondary | ICD-10-CM

## 2018-01-27 NOTE — Progress Notes (Signed)
[facility] Visit Note  Patient: Terry Noble MRN: [id number] Date of Birth: May 30, 1966 PCP: Oswaldo Conroy, MD   Terry Noble 52 y.o. female presents for an initial MTM visit today.  BP 139/79 (BP Location: Right Arm, Patient Position: Sitting, Cuff Size: Large)   Ht 5\' 5"  (1.651 m)   Wt 289 lb (131.1 kg)   BMI 48.09 kg/m   Patient Information   Past Medical History:  Diagnosis Date  . CHF (congestive heart failure) (HCC)   . Diabetes mellitus without complication (HCC)   . Hypertension   . Morbid obesity (HCC)   . Sleep apnea       Past Surgical History:  Procedure Laterality Date  . none       Family History  Problem Relation Age of Onset  . Hypertension Mother     New Diagnoses (since last visit): n/a  Family Support: lives with dtr, who is her financial support, pt has applied for medicaid            Social History   Substance and Sexual Activity  Alcohol Use No  . Frequency: Never      Social History   Tobacco Use  Smoking Status Former Smoker  Smokeless Tobacco Current User  . Types: Chew      Health Maintenance  Topic Date Due  . HEMOGLOBIN A1C  01/12/1967  . PNEUMOCOCCAL POLYSACCHARIDE VACCINE (1) 05/31/1969  . FOOT EXAM  05/31/1977  . OPHTHALMOLOGY EXAM  05/31/1977  . TETANUS/TDAP  05/31/1986  . PAP SMEAR  05/31/1988  . MAMMOGRAM  05/31/2017  . COLONOSCOPY  05/31/2017  . INFLUENZA VACCINE  05/27/2018  . HIV Screening  Completed   Allergies: PCN, cherry, shellfish, furosemide, oxycodone, peanuts  Outpatient Encounter Medications as of 01/27/2018  Medication Sig  . aspirin EC 81 MG tablet Take 81 mg by mouth daily.  03/29/2018 EPINEPHrine (EPIPEN 2-PAK) 0.3 mg/0.3 mL IJ SOAJ injection Inject 0.3 mg into the muscle once.  . gabapentin (NEURONTIN) 300 MG capsule Take 300 mg by mouth 3 (three) times daily.  Marland Kitchen lisinopril (PRINIVIL,ZESTRIL) 20 MG tablet Take 20 mg by mouth daily.  Marland Kitchen loratadine (CLARITIN) 10 MG tablet  Take 10 mg by mouth daily.  . magnesium oxide (MAG-OX) 400 MG tablet Take 400 mg by mouth 2 (two) times daily.  . metFORMIN (GLUCOPHAGE) 1000 MG tablet Take 1,000 mg by mouth daily with breakfast.  . metoprolol succinate (TOPROL-XL) 25 MG 24 hr tablet Take 25 mg by mouth daily.  . Omega-3 Fatty Acids (FISH OIL) 1000 MG CAPS Take 1 capsule by mouth 2 (two) times daily.  . potassium chloride (K-DUR) 10 MEQ tablet Take 1 tablet (10 mEq total) by mouth daily.  . sertraline (ZOLOFT) 100 MG tablet Take 100 mg by mouth daily.  Marland Kitchen torsemide (DEMADEX) 20 MG tablet Take 1 tablet (20 mg total) by mouth daily.  . [DISCONTINUED] metFORMIN (GLUCOPHAGE) 500 MG tablet Take 500 mg by mouth 2 (two) times daily with a meal.  . acetaminophen (TYLENOL) 500 MG tablet Take 500 mg by mouth every 6 (six) hours as needed.  . [DISCONTINUED] hydrocortisone 2.5 % cream Apply 1 application topically 2 (two) times daily.   No facility-administered encounter medications on file as of 01/27/2018.     Assessment and Plan:  Compliance/Adherance:knows 3W's of her medication with very little prompting. States that if doses are missed it is because she does not have money to purchase her meds. She states that when she has  her meds she does not miss doses.  Where has she been getting med filled ? We only have 3 on file here - gets meds filled at [facility], explained that if she is deemed eligible and brings in the required paperwork. she can get all her meds filled here.  HTN: BP [id number]; within goal BP <140/90.  CHF: NYHA class III last EF 55% 12/23/17, seen for management by Clarisa [facility] in [facility], next appt is in 02/2018. Weight is stable. Counseled pt on weight gain. She states she weighs everyday and knows that if she experiences a gain >2 lbs overnight or >5lbs/week that she should call md office. Pt states she is on fluid restriction and must intake less than 2L of fluid daily.  DM:do not have access to  HgA1c; pt states her last A1c was 6.4 in Dec 2018. Pt is close to ideal goal is <6.0. last BG at office visit on 01/08/18 was 163 - encouraged pt to request referral to dietician when she sees her PCP again. States she checks sugars every morning  - usually run about 129. She drinks only water and has cut back on bread and rice.   Return 45mos for f/u.  Denice Paradise, PharmD, RPh [facility]) [phone]

## 2018-03-11 ENCOUNTER — Ambulatory Visit: Payer: Self-pay | Admitting: Family

## 2018-03-15 ENCOUNTER — Ambulatory Visit: Payer: Self-pay | Admitting: Family

## 2018-03-26 ENCOUNTER — Ambulatory Visit: Payer: Self-pay | Admitting: Family

## 2018-04-07 ENCOUNTER — Ambulatory Visit: Payer: Self-pay | Admitting: Family

## 2018-04-27 ENCOUNTER — Ambulatory Visit: Payer: Self-pay | Admitting: Family

## 2018-05-14 ENCOUNTER — Encounter: Payer: Self-pay | Admitting: Family

## 2018-05-14 ENCOUNTER — Ambulatory Visit: Payer: Medicaid Other | Attending: Family | Admitting: Family

## 2018-05-14 VITALS — BP 122/86 | HR 87 | Temp 87.0°F | Resp 18 | Ht 65.0 in | Wt 284.0 lb

## 2018-05-14 DIAGNOSIS — I10 Essential (primary) hypertension: Secondary | ICD-10-CM

## 2018-05-14 DIAGNOSIS — E119 Type 2 diabetes mellitus without complications: Secondary | ICD-10-CM | POA: Diagnosis not present

## 2018-05-14 DIAGNOSIS — I110 Hypertensive heart disease with heart failure: Secondary | ICD-10-CM | POA: Diagnosis not present

## 2018-05-14 DIAGNOSIS — Z7984 Long term (current) use of oral hypoglycemic drugs: Secondary | ICD-10-CM | POA: Diagnosis not present

## 2018-05-14 DIAGNOSIS — I5032 Chronic diastolic (congestive) heart failure: Secondary | ICD-10-CM

## 2018-05-14 DIAGNOSIS — G4733 Obstructive sleep apnea (adult) (pediatric): Secondary | ICD-10-CM

## 2018-05-14 DIAGNOSIS — Z885 Allergy status to narcotic agent status: Secondary | ICD-10-CM | POA: Diagnosis not present

## 2018-05-14 DIAGNOSIS — Z888 Allergy status to other drugs, medicaments and biological substances status: Secondary | ICD-10-CM | POA: Insufficient documentation

## 2018-05-14 DIAGNOSIS — Z7982 Long term (current) use of aspirin: Secondary | ICD-10-CM | POA: Insufficient documentation

## 2018-05-14 DIAGNOSIS — Z880 Allergy status to penicillin: Secondary | ICD-10-CM | POA: Insufficient documentation

## 2018-05-14 DIAGNOSIS — Z91013 Allergy to seafood: Secondary | ICD-10-CM | POA: Insufficient documentation

## 2018-05-14 DIAGNOSIS — Z79899 Other long term (current) drug therapy: Secondary | ICD-10-CM | POA: Insufficient documentation

## 2018-05-14 DIAGNOSIS — Z8249 Family history of ischemic heart disease and other diseases of the circulatory system: Secondary | ICD-10-CM | POA: Insufficient documentation

## 2018-05-14 DIAGNOSIS — I509 Heart failure, unspecified: Secondary | ICD-10-CM | POA: Insufficient documentation

## 2018-05-14 DIAGNOSIS — Z87891 Personal history of nicotine dependence: Secondary | ICD-10-CM | POA: Insufficient documentation

## 2018-05-14 DIAGNOSIS — Z6841 Body Mass Index (BMI) 40.0 and over, adult: Secondary | ICD-10-CM | POA: Insufficient documentation

## 2018-05-14 DIAGNOSIS — Z720 Tobacco use: Secondary | ICD-10-CM

## 2018-05-14 DIAGNOSIS — R5383 Other fatigue: Secondary | ICD-10-CM | POA: Diagnosis present

## 2018-05-14 LAB — GLUCOSE, CAPILLARY: Glucose-Capillary: 127 mg/dL — ABNORMAL HIGH (ref 70–99)

## 2018-05-14 MED ORDER — FUROSEMIDE 20 MG PO TABS: 20.0000 mg | ORAL_TABLET | ORAL | 3 refills | Status: DC | PRN

## 2018-05-14 NOTE — Progress Notes (Signed)
Patient ID: Terry Noble, female    DOB: [birth year], 52 y.o.   MRN: [id number]  HPI  Terry Noble is a 52 y/o female with a history of DM, HTN, obstructive sleep apnea, current tobacco use (chews) and chronic heart failure.   Echo report from 12/23/17 reviewed and showed an EF of 55%.  Admitted 12/23/17 due to influenza and mild heart failure. Initially given IV lasix and then transitioned to oral diuretics. Tamiflu was also given. Discharged with home oxygen after 2 days.   She presents today for a follow-up visit with a chief complaint of minimal fatigue upon moderate exertion. She describes this as chronic in nature having been present for several years. She has associated cough, light-headedness and intermittent headaches along with this. She denies any difficulty sleeping, abdominal distention, palpitations, edema, chest pain, shortness of breath or chest pain. She did not take any of her medications yet today  Past Medical History:  Diagnosis Date  . CHF (congestive heart failure) (HCC)   . Diabetes mellitus without complication (HCC)   . Hypertension   . Morbid obesity (HCC)   . Sleep apnea    Past Surgical History:  Procedure Laterality Date  . none     Family History  Problem Relation Age of Onset  . Hypertension Mother    Social History   Tobacco Use  . Smoking status: Former Games developer  . Smokeless tobacco: Current User    Types: Chew  Substance Use Topics  . Alcohol use: No    Frequency: Never   Allergies  Allergen Reactions  . Shellfish Allergy Hives and Shortness Of Breath  . Cherry   . Lasix [Furosemide] Swelling  . Oxycodone   . Penicillins    Prior to Admission medications   Medication Sig Start Date End Date Taking? Authorizing Provider  acetaminophen (TYLENOL) 500 MG tablet Take 500 mg by mouth every 6 (six) hours as needed.   Yes [provider]  aspirin EC 81 MG tablet Take 81 mg by mouth daily.   Yes [provider]  EPINEPHrine (EPIPEN  2-PAK) 0.3 mg/0.3 mL IJ SOAJ injection Inject 0.3 mg into the muscle once.   Yes [provider]  gabapentin (NEURONTIN) 300 MG capsule Take 300 mg by mouth 3 (three) times daily. Pt taking twice a day   Yes [provider]  lisinopril (PRINIVIL,ZESTRIL) 20 MG tablet Take 20 mg by mouth daily.   Yes [provider]  Magnesium Bisglycinate Dihyd POWD 200 mg by Does not apply route daily.   Yes [provider]  metFORMIN (GLUCOPHAGE) 1000 MG tablet Take 1,000 mg by mouth 2 (two) times daily with a meal.    Yes [provider]  metoprolol succinate (TOPROL-XL) 25 MG 24 hr tablet Take 25 mg by mouth daily.   Yes [provider]  Omega-3 Fatty Acids (FISH OIL) 1000 MG CAPS Take 1 capsule by mouth daily.    Yes [provider]  sertraline (ZOLOFT) 100 MG tablet Take 100 mg by mouth daily.   Yes [provider]    Review of Systems  Constitutional: Positive for fatigue. Negative for appetite change.  HENT: Negative for congestion, postnasal drip and sore throat.   Eyes: Negative.   Respiratory: Positive for cough (dry cough). Negative for chest tightness and shortness of breath.   Cardiovascular: Negative for chest pain, palpitations and leg swelling.  Gastrointestinal: Negative for abdominal distention and abdominal pain.  Endocrine: Negative.   Genitourinary: Negative.  Musculoskeletal: Negative for back pain and neck pain.  Skin: Negative.   Allergic/Immunologic: Negative.   Neurological: Positive for light-headedness (infrequent) and headaches (infrequent). Negative for dizziness.  Hematological: Negative for adenopathy. Does not bruise/bleed easily.  Psychiatric/Behavioral: Negative for dysphoric mood and sleep disturbance. The patient is not nervous/anxious.    Vitals:   05/14/18 0939  BP: (!) 130/107  Pulse: 87  Resp: 18  Temp: (!) 87 F (30.6 C)  SpO2: 98%  Weight: 284 lb (128.8 kg)  Height: 5\' 5"  (1.651 m)    Wt Readings from Last 3 Encounters:  05/14/18 284 lb (128.8 kg)  01/27/18 289 lb (131.1 kg)  01/08/18 292 lb 6 oz (132.6 kg)   Lab Results  Component Value Date   CREATININE 0.97 12/25/2017   CREATININE 0.91 12/24/2017   CREATININE 0.85 12/23/2017   Physical Exam  Constitutional: She is oriented to person, place, and time. She appears well-developed and well-nourished.  HENT:  Head: Normocephalic and atraumatic.  Neck: Normal range of motion. Neck supple. No JVD present.  Cardiovascular: Normal rate and regular rhythm.  Pulmonary/Chest: Effort normal. She has no wheezes. She has no rales.  Abdominal: Soft. She exhibits no distension. There is no tenderness.  Musculoskeletal: She exhibits no edema or tenderness.  Neurological: She is alert and oriented to person, place, and time.  Skin: Skin is warm and dry.  Psychiatric: She has a normal mood and affect. Her behavior is normal. Thought content normal.  Nursing note and vitals reviewed.  Assessment & Plan:  1: Chronic heart failure with preserved ejection fraction- - NYHA class II - euvolemic today - weighing daily and she says that her weight has gradually declined. Instructed to call for an overnight weight gain of >2 pounds or a weekly weight gain of >5 pounds - weight down 8 pounds from last visit here 4 months ago - walking 3 days/ week for ~ 10-15 minutes each time; encouraged her to slowly increase that and work her way up to 30 minutes / day - not adding salt to her food. Reminded about closely following a 2000mg  sodium diet  - BNP on 12/23/17 was 116.0 - PharmD reconciled medications with the patient - does not have furosemide RX so [facility] so that she could have it on hand. Can take it as needed for above weight parameters or edema  2: DM- - fasting glucose in clinic was 127 - follows with PCP @ [facility] & returns 02/09/18  3: HTN- - BP elevated  today (130/107) but improved upon recheck (122/86) - has not taken any of her medications yet today but will do so as soon as she returns home - encouraged her to take her medications before coming to the office so that we can evaluate the effectiveness of the medication - BMP from 12/25/17 reviewed and showed sodium 138, potassium 3.1 and GFR >60  4: Obstructive sleep apnea- - wearing CPAP inconsistently due to electric bill; wears it 3 nights/ week about an hour each time - feels better after she wears it though - discussion had with her about the importance of wearing her CPAP nightly for at least 6 hours a night  5: Tobacco use- - + chewing tobacco  - complete cessation discussed for 3 minutes with her  Patient did not bring her medications nor a list. Each medication was verbally reviewed with the patient and she was encouraged to bring the bottles  to every visit to confirm accuracy of list.  Return in 4 months or sooner for any questions/problems before then.

## 2018-05-14 NOTE — Patient Instructions (Signed)
Continue weighing daily and call for an overnight weight gain of > 2 pounds or a weekly weight gain of >5 pounds. 

## 2018-07-22 ENCOUNTER — Encounter (INDEPENDENT_AMBULATORY_CARE_PROVIDER_SITE_OTHER): Payer: Self-pay

## 2018-07-22 ENCOUNTER — Encounter: Payer: Self-pay | Admitting: Pharmacist

## 2018-07-22 ENCOUNTER — Ambulatory Visit: Payer: Self-pay | Admitting: Pharmacist

## 2018-07-22 ENCOUNTER — Other Ambulatory Visit: Payer: Self-pay

## 2018-07-22 VITALS — BP 132/78 | Ht 65.0 in | Wt 278.0 lb

## 2018-07-22 DIAGNOSIS — Z79899 Other long term (current) drug therapy: Secondary | ICD-10-CM

## 2018-07-22 NOTE — Progress Notes (Signed)
[facility] Visit Note  Patient: Massiel Stipp MRN: [id number] Date of Birth: 12-27-1965 PCP: Oswaldo Conroy, MD   [name] 52 y.o. female presents for a 6 MOS F/U MTM visit today.   BP 132/78 (BP Location: Right Arm, Patient Position: Sitting, Cuff Size: Large)   Ht 5\' 5"  (1.651 m)   Wt 278 lb (126.1 kg)   BMI 46.26 kg/m   Patient Information   Past Medical History:  Diagnosis Date  . CHF (congestive heart failure) (HCC)   . Diabetes mellitus without complication (HCC)   . Hypertension   . Morbid obesity (HCC)   . Sleep apnea       Past Surgical History:  Procedure Laterality Date  . none       Family History  Problem Relation Age of Onset  . Hypertension Mother     New Diagnoses (since last visit):   Family Support:GOOD   Lifestyle/ Diet: I praised Pt since she has been doing very well and has been watching her diet very carefully. She has lost 11lbs since her last visit in April. I encouraged her to keep making small manageable changes.  She has been walking some she states but has been having trouble with sinus/seasonal allergy issues.  She states she has been eating mostly chicken and beef and has been watching her carbs. She watches ingredients carefully due to her  food allergies and has been adhering to a low sodium diet 2000mg /day. She expressed frustration with having to watch her diet so closely due to her seafood and other food allergies since that eliminates other healthy options for her. She asked me if she could take a stronger allergy medication like cetirizine so she can eat seafood. I explained that since she has facial swelling and hives with seafood, that an allergy medication would not prevent this. I explained that the reaction could put her in danger of anaphylaxis. She stated she understood and told me that she does have epipens. She has not had them filled here at [facility] and I stated I would request an RX from her PCP so  that we can get them via pt assistance from the manufacturer.          Social History   Substance and Sexual Activity  Alcohol Use No  . Frequency: Never      Social History   Tobacco Use  Smoking Status Former Smoker  Smokeless Tobacco Current User  . Types: Chew      Health Maintenance  Topic Date Due  . HEMOGLOBIN A1C  February 11, 1967  . FOOT EXAM  05/31/1977  . OPHTHALMOLOGY EXAM  05/31/1977  . TETANUS/TDAP  05/31/1986  . PAP SMEAR  05/31/1988  . MAMMOGRAM  05/31/2017  . COLONOSCOPY  05/31/2017  . INFLUENZA VACCINE  05/27/2018  . PNEUMOCOCCAL POLYSACCHARIDE VACCINE AGE 59-64 HIGH RISK  Completed  . HIV Screening  Completed    Outpatient Encounter Medications as of 07/22/2018  Medication Sig  . acetaminophen (TYLENOL) 500 MG tablet Take 500 mg by mouth every 6 (six) hours as needed.  07/24/2018 aspirin EC 81 MG tablet Take 81 mg by mouth daily.  Marland Kitchen EPINEPHrine (EPIPEN 2-PAK) 0.3 mg/0.3 mL IJ SOAJ injection Inject 0.3 mg into the muscle once.  . gabapentin (NEURONTIN) 300 MG capsule Take 300 mg by mouth 3 (three) times daily. Pt taking twice a day  . lisinopril (PRINIVIL,ZESTRIL) 20 MG tablet Take 20 mg by mouth daily.  . Magnesium Bisglycinate Dihyd POWD 200  mg by Does not apply route daily.  . metFORMIN (GLUCOPHAGE) 1000 MG tablet Take 1,000 mg by mouth 2 (two) times daily with a meal.   . metoprolol succinate (TOPROL-XL) 25 MG 24 hr tablet Take 25 mg by mouth daily.  . sertraline (ZOLOFT) 100 MG tablet Take 100 mg by mouth daily.  Marland Kitchen torsemide (DEMADEX) 20 MG tablet Take 20 mg by mouth 2 (two) times daily.  . Omega-3 Fatty Acids (FISH OIL) 1000 MG CAPS Take 1 capsule by mouth daily.   . [DISCONTINUED] furosemide (LASIX) 20 MG tablet Take 1 tablet (20 mg total) by mouth as needed for fluid or edema (for weight gain).   No facility-administered encounter medications on file as of 07/22/2018.     Assessment and Plan:  Compliance/Adherance: pt knows the  names/frequency/indications of all her medications without prompting and states she does not miss doses. She has been getting her refills filled on time.  HTN/Heart Failure: bp today 132/78 almost at goal of < 130/80.  Headaches: Since pt BP reading is on target I told her I do not think the h/a are due to her BP. I explained that low BP an also cause h/a as well, but that did not seem likely either given that her BP stays stable. I do thinnk that her h/a could be sinus related since she stated at one point that she "could not go outside without her sinus stuffing up."  Pt requesting allergy medicine due to sinus issues. I told her I would request cetirizine and fluticasone NS and possibly a ventolin inhaler since she gets short of breath sometimes.   RTC: 6 mos  Denice Paradise, PharmD, RPh [facility]) [phone]

## 2018-07-29 ENCOUNTER — Encounter: Payer: Self-pay | Admitting: Pharmacist

## 2018-09-14 ENCOUNTER — Encounter: Payer: Self-pay | Admitting: Family

## 2018-09-14 ENCOUNTER — Ambulatory Visit: Payer: Medicaid Other | Attending: Family | Admitting: Family

## 2018-09-14 VITALS — BP 118/78 | HR 75 | Resp 18 | Ht 65.0 in | Wt 269.1 lb

## 2018-09-14 DIAGNOSIS — Z7984 Long term (current) use of oral hypoglycemic drugs: Secondary | ICD-10-CM | POA: Diagnosis not present

## 2018-09-14 DIAGNOSIS — F17220 Nicotine dependence, chewing tobacco, uncomplicated: Secondary | ICD-10-CM | POA: Diagnosis not present

## 2018-09-14 DIAGNOSIS — E119 Type 2 diabetes mellitus without complications: Secondary | ICD-10-CM | POA: Insufficient documentation

## 2018-09-14 DIAGNOSIS — I509 Heart failure, unspecified: Secondary | ICD-10-CM | POA: Diagnosis present

## 2018-09-14 DIAGNOSIS — Z8249 Family history of ischemic heart disease and other diseases of the circulatory system: Secondary | ICD-10-CM | POA: Insufficient documentation

## 2018-09-14 DIAGNOSIS — I5032 Chronic diastolic (congestive) heart failure: Secondary | ICD-10-CM | POA: Insufficient documentation

## 2018-09-14 DIAGNOSIS — I110 Hypertensive heart disease with heart failure: Secondary | ICD-10-CM | POA: Diagnosis not present

## 2018-09-14 DIAGNOSIS — I10 Essential (primary) hypertension: Secondary | ICD-10-CM

## 2018-09-14 DIAGNOSIS — G4733 Obstructive sleep apnea (adult) (pediatric): Secondary | ICD-10-CM | POA: Insufficient documentation

## 2018-09-14 DIAGNOSIS — Z79899 Other long term (current) drug therapy: Secondary | ICD-10-CM | POA: Insufficient documentation

## 2018-09-14 DIAGNOSIS — Z720 Tobacco use: Secondary | ICD-10-CM

## 2018-09-14 DIAGNOSIS — Z7982 Long term (current) use of aspirin: Secondary | ICD-10-CM | POA: Insufficient documentation

## 2018-09-14 NOTE — Progress Notes (Signed)
Patient ID: Terry Noble, female    DOB: 03/12/1966, 52 y.o.   MRN: [id number]  HPI  Terry Noble is a 52 y/o female with a history of DM, HTN, obstructive sleep apnea, current tobacco use (chews) and chronic heart failure.   Echo report from 12/23/17 reviewed and showed an EF of 55%.  Has not been admitted or been in the ED in the last 6 months  She presents today for a follow-up visit with a chief complaint of minimal fatigue upon moderate exertion. She describes this as chronic in nature having been present for several years. She has associated cough, shortness of breath and light-headedness along with this. She denies any difficulty sleeping, abdominal distention, palpitations, pedal edema,chest pain or weight gain.   Past Medical History:  Diagnosis Date  . CHF (congestive heart failure) (HCC)   . Diabetes mellitus without complication (HCC)   . Hypertension   . Morbid obesity (HCC)   . Sleep apnea    Past Surgical History:  Procedure Laterality Date  . none     Family History  Problem Relation Age of Onset  . Hypertension Mother    Social History   Tobacco Use  . Smoking status: Former Games developer  . Smokeless tobacco: Current User    Types: Chew  Substance Use Topics  . Alcohol use: No    Frequency: Never   Allergies  Allergen Reactions  . Shellfish Allergy Hives and Shortness Of Breath  . Cherry   . Cucumber Extract Hives    pickles  . Lasix [Furosemide] Swelling  . Orange Fruit [Citrus] Hives  . Oxycodone   . Peanut-Containing Drug Products Hives  . Penicillins    Prior to Admission medications   Medication Sig Start Date End Date Taking? Authorizing Provider  acetaminophen (TYLENOL) 500 MG tablet Take 500 mg by mouth every 6 (six) hours as needed.   Yes [provider]  aspirin EC 81 MG tablet Take 81 mg by mouth daily.   Yes [provider]  EPINEPHrine (EPIPEN 2-PAK) 0.3 mg/0.3 mL IJ SOAJ injection Inject 0.3 mg into the muscle once.   Yes  [provider]  gabapentin (NEURONTIN) 300 MG capsule Take 600 mg by mouth daily. Takes 2 capsules once daily   Yes [provider]  lisinopril (PRINIVIL,ZESTRIL) 20 MG tablet Take 20 mg by mouth daily.   Yes [provider]  loratadine (CLARITIN) 10 MG tablet Take 10 mg by mouth daily.   Yes [provider]  Magnesium Bisglycinate Dihyd POWD 200 mg by Does not apply route daily.   Yes [provider]  metFORMIN (GLUCOPHAGE) 1000 MG tablet Take 1,000 mg by mouth daily with breakfast.    Yes [provider]  metoprolol succinate (TOPROL-XL) 25 MG 24 hr tablet Take 25 mg by mouth daily.   Yes [provider]  Omega-3 Fatty Acids (FISH OIL) 1000 MG CAPS Take 1 capsule by mouth daily.    Yes [provider]  sertraline (ZOLOFT) 100 MG tablet Take 100 mg by mouth daily.   Yes [provider]  torsemide (DEMADEX) 20 MG tablet Take 40 mg by mouth daily.    Yes [provider]    Review of Systems  Constitutional: Positive for fatigue (improving). Negative for appetite change.  HENT: Negative for congestion, postnasal drip and sore throat.   Eyes: Negative.   Respiratory: Positive for cough (dry cough) and shortness of breath (minimal). Negative for chest tightness.   Cardiovascular:  Negative for chest pain, palpitations and leg swelling.  Gastrointestinal: Negative for abdominal distention and abdominal pain.  Endocrine: Negative.   Genitourinary: Negative.   Musculoskeletal: Negative for back pain and neck pain.  Skin: Negative.   Allergic/Immunologic: Negative.   Neurological: Positive for light-headedness (infrequent) and headaches (infrequent). Negative for dizziness.  Hematological: Negative for adenopathy. Does not bruise/bleed easily.  Psychiatric/Behavioral: Negative for dysphoric mood and sleep disturbance. The patient is not nervous/anxious.    Vitals:   09/14/18 1010  BP: (!) 127/111  Pulse:  75  Resp: 18  SpO2: 99%  Weight: 269 lb 2 oz (122.1 kg)  Height: 5\' 5"  (1.651 m)   Wt Readings from Last 3 Encounters:  09/14/18 269 lb 2 oz (122.1 kg)  07/22/18 278 lb (126.1 kg)  05/14/18 284 lb (128.8 kg)   Lab Results  Component Value Date   CREATININE 0.97 12/25/2017   CREATININE 0.91 12/24/2017   CREATININE 0.85 12/23/2017    Physical Exam  Constitutional: She is oriented to person, place, and time. She appears well-developed and well-nourished.  HENT:  Head: Normocephalic and atraumatic.  Neck: Normal range of motion. Neck supple. No JVD present.  Cardiovascular: Normal rate and regular rhythm.  Pulmonary/Chest: Effort normal. She has no wheezes. She has no rales.  Abdominal: Soft. She exhibits no distension. There is no tenderness.  Musculoskeletal: She exhibits no edema or tenderness.  Neurological: She is alert and oriented to person, place, and time.  Skin: Skin is warm and dry.  Psychiatric: She has a normal mood and affect. Her behavior is normal. Thought content normal.  Nursing note and vitals reviewed.  Assessment & Plan:  1: Chronic heart failure with preserved ejection fraction- - NYHA class II - euvolemic today - weighing daily and she says that her weight has gradually declined. Instructed to call for an overnight weight gain of >2 pounds or a weekly weight gain of >5 pounds - weight down 15 pounds from last visit 4 months ago - walking 3 days/ week for ~ 10-15 minutes each time; encouraged her to slowly increase that and work her way up to 30 minutes / day - also chasing grandbaby for exercise - not adding salt to her food. Reminded about closely following a 2000mg  sodium diet  - BNP on 12/23/17 was 116.0 - PharmD reconciled medications with the patient - she does not take the flu vaccine; encouraged good handwashing  2: DM- - follows with PCP @ [facility] & returns 09/28/18  3: HTN- - BP elevated initially (127/111) and  then improved with recheck with manual cuff (118/78) - BMP from 12/25/17 reviewed and showed sodium 138, potassium 3.1 and GFR >60  4: Obstructive sleep apnea- - wearing CPAP inconsistently due to electric bill; now wearing it 3 nights/ week about 4 hours each time - feels better after she wears it   5: Tobacco use- - + chewing tobacco  - complete cessation discussed for 3 minutes with her  Patient did not bring her medications nor a list. Each medication was verbally reviewed with the patient and she was encouraged to bring the bottles to every visit to confirm accuracy of list.  Return in 6 months or sooner for any questions/problems before then.

## 2018-09-14 NOTE — Patient Instructions (Signed)
Continue weighing daily and call for an overnight weight gain of > 2 pounds or a weekly weight gain of >5 pounds. 

## 2018-09-15 ENCOUNTER — Encounter: Payer: Self-pay | Admitting: Family

## 2019-01-26 ENCOUNTER — Telehealth: Payer: Self-pay | Admitting: Pharmacist

## 2019-01-26 NOTE — Telephone Encounter (Signed)
Contacted patient to perform MTM via outreach call rather than in person. Patient is agreeable, and will contact her at 10 AM on 4/2.   Mauri Reading, PharmD Pharmacy Resident  01/26/2019 3:03 PM

## 2019-01-27 ENCOUNTER — Ambulatory Visit: Payer: Self-pay | Admitting: Pharmacist

## 2019-01-27 ENCOUNTER — Encounter (INDEPENDENT_AMBULATORY_CARE_PROVIDER_SITE_OTHER): Payer: Self-pay

## 2019-01-27 ENCOUNTER — Encounter: Payer: Self-pay | Admitting: Pharmacist

## 2019-01-27 ENCOUNTER — Other Ambulatory Visit: Payer: Self-pay

## 2019-01-27 DIAGNOSIS — Z79899 Other long term (current) drug therapy: Secondary | ICD-10-CM

## 2019-01-27 NOTE — Progress Notes (Signed)
  [facility] Visit Note  Patient: Terry Noble MRN: [id number] Date of Birth: 1966-10-07 PCP: Oswaldo Conroy, MD   [name] 53 y.o. female presents for a MTM visit today.  There were no vitals taken for this visit.  Patient Information   Past Medical History:  Diagnosis Date  . CHF (congestive heart failure) (HCC)   . Diabetes mellitus without complication (HCC)   . Hypertension   . Morbid obesity (HCC)   . Sleep apnea       Past Surgical History:  Procedure Laterality Date  . none       Family History  Problem Relation Age of Onset  . Hypertension Mother     New Diagnoses (since last visit):   Family Support: Good            Social History   Substance and Sexual Activity  Alcohol Use No  . Frequency: Never      Social History   Tobacco Use  Smoking Status Former Smoker  Smokeless Tobacco Current User  . Types: Chew      Health Maintenance  Topic Date Due  . HEMOGLOBIN A1C  November 13, 1966  . FOOT EXAM  05/31/1977  . OPHTHALMOLOGY EXAM  05/31/1977  . TETANUS/TDAP  05/31/1986  . PAP SMEAR-Modifier  05/31/1988  . MAMMOGRAM  05/31/2017  . COLONOSCOPY  05/31/2017  . INFLUENZA VACCINE  05/28/2019  . PNEUMOCOCCAL POLYSACCHARIDE VACCINE AGE 61-64 HIGH RISK  Completed  . HIV Screening  Completed     Assessment and Plan: 1. Diabetes (metformin): meter not working discussed with patient importance of checking sugars at least a few times each week. Discussed bringing her meter in here or to [facility] where she is seen for troubleshooting, battery replacement, or potentially new meter. No current side effects or issues with medications at this time. Knows S/S of hypoglycemia and how to treat.  2. CHF (metoprolol, torsemide, lisinopril): knows to contact PCP if weight gain of 3lbs or more in a day or 5lbs or more in a week. Patient has lost weight from medication compliance as well as walking 3x/week.  3. Psychiatric (Sertraline,  Aripiprazole): Aripiprazole is a new medication for her, and she is doing well with it overall  Weight 255 lbs - has been walking 3x/week and watching snack intake as well as monitoring her sodium intake. Congratulated patient and discussed pertinence of keeping these healthy habits up. Misses medications ~2x/week. She uses a pill box, but needs an additional one; offered to give her a new one when she comes in for her refills. Patient also has good support system to remind her of her medication times as well as uses apps on her cell phone. Visit was limited by telephone interaction.  Mauri Reading, PharmD Pharmacy Resident  01/27/2019 9:52 AM

## 2019-01-28 ENCOUNTER — Ambulatory Visit: Payer: Self-pay | Admitting: Pharmacy Technician

## 2019-01-28 DIAGNOSIS — Z79899 Other long term (current) drug therapy: Secondary | ICD-10-CM

## 2019-02-01 NOTE — Progress Notes (Signed)
Contact patient via telephone.  Completed [facility]'s application, [facility] Attestation, PAP Authorization and [id number] for recertification. Patient supported by daughter, Onnolee Maywald.  Patient to provide last 30 day of pay stubs and 2019 tax return from daughter.  Sherilyn Dacosta Care Manager [facility]

## 2019-03-15 ENCOUNTER — Ambulatory Visit: Payer: Self-pay | Admitting: Family

## 2019-03-16 ENCOUNTER — Telehealth: Payer: Self-pay

## 2019-03-16 NOTE — Telephone Encounter (Signed)
TELEPHONE CALL NOTE  Terry Noble has been deemed a candidate for a follow-up tele-health visit to limit community exposure during the Covid-19 pandemic. I spoke with the patient via phone to ensure availability of phone/video source, confirm preferred email & phone number, discuss instructions and expectations, and review consent.   I reminded Terry Noble to be prepared with any vital sign and/or heart rhythm information that could potentially be obtained via home monitoring, at the time of her visit.  Finally, I reminded Terry Noble to expect an e-mail containing a link for their video-based visit approximately 15 minutes before her visit, or alternatively, a phone call at the time of her visit if her visit is planned to be a phone encounter.  Did the patient verbally consent to treatment as below? YES  Terry Noble, CMA 03/16/2019 1:41 PM  CONSENT FOR TELE-HEALTH VISIT - PLEASE REVIEW  I hereby voluntarily request, consent and authorize [facility] and its employed or contracted physicians, physician assistants, nurse practitioners or other licensed health care professionals (the Practitioner), to provide me with telemedicine health care services (the "Services") as deemed necessary by the treating Practitioner. I acknowledge and consent to receive the Services by the Practitioner via telemedicine. I understand that the telemedicine visit will involve communicating with the Practitioner through telephonic communication technology and the disclosure of certain medical information by electronic transmission. I acknowledge that I have been given the opportunity to request an in-person assessment or other available alternative prior to the telemedicine visit and am voluntarily participating in the telemedicine visit.  I understand that I have the right to withhold or withdraw my consent to the use of telemedicine in the course of my care at any time, without affecting my right to  future care or treatment, and that the Practitioner or I may terminate the telemedicine visit at any time. I understand that I have the right to inspect all information obtained and/or recorded in the course of the telemedicine visit and may receive copies of available information for a reasonable fee.  I understand that some of the potential risks of receiving the Services via telemedicine include:  Marland Kitchen Delay or interruption in medical evaluation due to technological equipment failure or disruption; . Information transmitted may not be sufficient (e.g. poor resolution of images) to allow for appropriate medical decision making by the Practitioner; and/or  . In rare instances, security protocols could fail, causing a breach of personal health information.  Furthermore, I acknowledge that it is my responsibility to provide information about my medical history, conditions and care that is complete and accurate to the best of my ability. I acknowledge that Practitioner's advice, recommendations, and/or decision may be based on factors not within their control, such as incomplete or inaccurate data provided by me or lack of visual representation. I understand that the practice of medicine is not an exact science and that Practitioner makes no warranties or guarantees regarding treatment outcomes. I acknowledge that I will receive a copy of this consent concurrently upon execution via email to the email address I last provided but may also request a printed copy by calling the office of [facility].    I understand that my insurance may be billed for this visit.   I have read or had this consent read to me. . I understand the contents of this consent, which adequately explains the benefits and risks of the Services being provided via telemedicine.  . I have been  provided ample opportunity to ask questions regarding this consent and the Services and have had my questions answered to my  satisfaction. . I give my informed consent for the services to be provided through the use of telemedicine in my medical care  By participating in this telemedicine visit I agree to the above.

## 2019-03-16 NOTE — Telephone Encounter (Signed)
   TELEPHONE CALL NOTE  This patient has been deemed a candidate for follow-up tele-health visit to limit community exposure during the Covid-19 pandemic. I spoke with the patient via phone to discuss instructions. The patient was advised to review the section on consent for treatment as well. The patient will receive a phone call 2-3 days prior to their E-Visit at which time consent will be verbally confirmed. A Virtual Office Visit appointment type has been scheduled for 03/18/2019 with Clarisa Kindred FNP.  [name], CMA 03/16/2019 1:41 PM

## 2019-03-18 ENCOUNTER — Encounter: Payer: Self-pay | Admitting: Family

## 2019-03-18 ENCOUNTER — Ambulatory Visit: Payer: Self-pay | Attending: Family | Admitting: Family

## 2019-03-18 ENCOUNTER — Other Ambulatory Visit: Payer: Self-pay

## 2019-03-18 VITALS — Wt 262.0 lb

## 2019-03-18 DIAGNOSIS — Z720 Tobacco use: Secondary | ICD-10-CM

## 2019-03-18 DIAGNOSIS — I10 Essential (primary) hypertension: Secondary | ICD-10-CM

## 2019-03-18 DIAGNOSIS — E119 Type 2 diabetes mellitus without complications: Secondary | ICD-10-CM

## 2019-03-18 DIAGNOSIS — I5032 Chronic diastolic (congestive) heart failure: Secondary | ICD-10-CM

## 2019-03-18 DIAGNOSIS — G4733 Obstructive sleep apnea (adult) (pediatric): Secondary | ICD-10-CM

## 2019-03-18 NOTE — Progress Notes (Signed)
Evaluation Performed:  Follow-up visit  This visit type was conducted due to national recommendations for restrictions regarding the COVID-19 Pandemic (e.g. social distancing).  This format is felt to be most appropriate for this patient at this time.  All issues noted in this document were discussed and addressed.  No physical exam was performed (except for noted visual exam findings with Video Visits).  Please refer to the patient's chart (MyChart message for video visits and phone note for telephone visits) for the patient's consent to telehealth for [facility]  Date:  03/18/2019   ID:  Terry Noble, Terry Noble 18-May-1966, MRN [id number]  Patient Location:  [address] [place] [address]   Provider location:   [facility] [address] [place], [place] [address]  PCP:  Oswaldo Conroy, MD  Cardiologist: Dorothyann Peng, MD Electrophysiologist:  None   Chief Complaint:  Fatigue  History of Present Illness:    Phillip Mcduff is a 53 y.o. female who presents via audio/video conferencing for a telehealth visit today.  Patient verified DOB and address.  The patient does not have symptoms concerning for COVID-19 infection (fever, chills, cough, or new SHORTNESS OF BREATH).   Patient reports moderate fatigue upon minimal exertion. She says that this has been chronic in nature having been present for several years. She has associated shortness of breath, palpitations and dizziness along with this. She denies any swelling in legs/ abdomen, chest pain, cough, difficulty sleeping or weight gain. Wearing her CPAP nightly. Has been walking more at home.   Prior CV studies:   The following studies were reviewed today:  Echo report from 12/23/17 reviewed and showed an EF of 55%  Past Medical History:  Diagnosis Date  . CHF (congestive heart failure) (HCC)   . Diabetes mellitus without complication (HCC)   . Hypertension   . Morbid obesity (HCC)   .  Sleep apnea    Past Surgical History:  Procedure Laterality Date  . none       Current Meds  Medication Sig  . ARIPiprazole (ABILIFY) 2 MG tablet Take 2 mg by mouth daily.  Marland Kitchen aspirin EC 81 MG tablet Take 81 mg by mouth daily.  Marland Kitchen EPINEPHrine (EPIPEN 2-PAK) 0.3 mg/0.3 mL IJ SOAJ injection Inject 0.3 mg into the muscle once.  . gabapentin (NEURONTIN) 300 MG capsule Take 300 mg by mouth 2 (two) times daily.   Marland Kitchen lisinopril (PRINIVIL,ZESTRIL) 20 MG tablet Take 20 mg by mouth daily.  Marland Kitchen loratadine (CLARITIN) 10 MG tablet Take 10 mg by mouth daily.  . metFORMIN (GLUCOPHAGE) 1000 MG tablet Take 1,000 mg by mouth 2 (two) times daily with a meal. 1500 mg daily  . metoprolol succinate (TOPROL-XL) 25 MG 24 hr tablet Take 25 mg by mouth daily.  . sertraline (ZOLOFT) 25 MG tablet Take 37.5 mg by mouth daily.   Marland Kitchen torsemide (DEMADEX) 20 MG tablet Take 40 mg by mouth daily.      Allergies:   Penicillins; Shellfish allergy; Cherry; Cucumber extract; Lasix [furosemide]; Orange fruit [citrus]; Oxycodone; and Peanut-containing drug products   Social History   Tobacco Use  . Smoking status: Former Games developer  . Smokeless tobacco: Current User    Types: Chew  Substance Use Topics  . Alcohol use: No    Frequency: Never  . Drug use: Not on file     Family Hx: The patient's family history includes Hypertension in her mother.  ROS:   Please see the history  of present illness.     All other systems reviewed and are negative.   Labs/Other Tests and Data Reviewed:    Recent Labs: No results found for requested labs within last 8760 hours.   Recent Lipid Panel No results found for: CHOL, TRIG, HDL, CHOLHDL, LDLCALC, LDLDIRECT  Wt Readings from Last 3 Encounters:  03/18/19 262 lb (118.8 kg)  09/14/18 269 lb 2 oz (122.1 kg)  07/22/18 278 lb (126.1 kg)     Exam:    Vital Signs:  Wt 262 lb (118.8 kg) Comment: self-reported  BMI 43.60 kg/m    Well nourished, well developed female in no  acute  distress.   ASSESSMENT & PLAN:    1. Chronic heart failure with preserved ejection fraction- - NYHA class III - euvolemic today based on patient's description of symptoms - weighing daily and she says that her weight has been stable; Instructed to call for an overnight weight gain of >2 pounds or a weekly weight gain of >5 pounds - walking 3 days/ week for ~ 10-15 minutes each time; encouraged her to slowly increase that and work her way up to 30 minutes / day - not adding salt to her food. Reminded about closely following a 2000mg  sodium diet  - BNP on 12/23/17 was 116.0 - saw cardiology Juliann Pares) 11/08/2018 - BNP 12/23/2018 was 116.0  2: DM- - follows with PCP @ [facility]  - glucose at home was 145  3: HTN- - not checking her BP at home - BMP from 12/25/17 reviewed and showed sodium 138, potassium 3.1 and GFR >60  4: Obstructive sleep apnea- - wearing CPAP nightly - feels better after she wears it   5: Tobacco use- - + chewing tobacco but she says that she's decreased usage - complete cessation discussed for 3 minutes with her  COVID-19 Education: The signs and symptoms of COVID-19 were discussed with the patient and how to seek care for testing (follow up with PCP or arrange E-visit).  The importance of social distancing was discussed today.  Patient Risk:   After full review of this patients clinical status, I feel that they are at least moderate risk at this time.  Time:   Today, I have spent 10 minutes with the patient with telehealth technology discussing medications, diet and symptoms to report.     Medication Adjustments/Labs and Tests Ordered: Current medicines are reviewed at length with the patient today.  Concerns regarding medicines are outlined above.   Tests Ordered: No orders of the defined types were placed in this encounter.  Medication Changes: No orders of the defined types were placed in this encounter.   Disposition:  Follow-up in 6 months or sooner for any questions/problems before then.   Signed, Delma Freeze, FNP  03/18/2019 11:42 AM    [facility]

## 2019-03-18 NOTE — Patient Instructions (Signed)
Continue weighing daily and call for an overnight weight gain of > 2 pounds or a weekly weight gain of >5 pounds. 

## 2019-04-22 ENCOUNTER — Other Ambulatory Visit: Payer: Self-pay | Admitting: Family

## 2019-04-22 DIAGNOSIS — I5032 Chronic diastolic (congestive) heart failure: Secondary | ICD-10-CM

## 2019-07-25 ENCOUNTER — Other Ambulatory Visit: Payer: Self-pay | Admitting: Pharmacist

## 2019-09-20 ENCOUNTER — Ambulatory Visit: Payer: Self-pay | Admitting: Family

## 2019-09-23 NOTE — Progress Notes (Deleted)
Patient ID: Terry Noble, female    DOB: Jan 27, 1966, 53 y.o.   MRN: [id number]  HPI  Terry Noble is a 53 y/o female with a history of DM, HTN, obstructive sleep apnea, current tobacco use (chews) and chronic heart failure.   Echo report from 12/23/17 reviewed and showed an EF of 55%.  Has not been admitted or been in the ED in the last 6 months  She presents today for a follow-up visit with a chief complaint of   Past Medical History:  Diagnosis Date  . CHF (congestive heart failure) ([place])   . Diabetes mellitus without complication ([place])   . Hypertension   . Morbid obesity ([place])   . Sleep apnea    Past Surgical History:  Procedure Laterality Date  . none     Family History  Problem Relation Age of Onset  . Hypertension Mother    Social History   Tobacco Use  . Smoking status: Former Research scientist (life sciences)  . Smokeless tobacco: Current User    Types: Chew  Substance Use Topics  . Alcohol use: No    Frequency: Never   Allergies  Allergen Reactions  . Penicillins Shortness Of Breath    Itching, swelling  . Shellfish Allergy Hives and Shortness Of Breath  . [place]   . Cucumber Extract Hives    pickles  . Lasix [Furosemide] Swelling  . Orange Fruit [Citrus] Hives  . Oxycodone   . Peanut-Containing Drug Products Hives     Review of Systems  Constitutional: Positive for fatigue (improving). Negative for appetite change.  HENT: Negative for congestion, postnasal drip and sore throat.   Eyes: Negative.   Respiratory: Positive for cough (dry cough) and shortness of breath (minimal). Negative for chest tightness.   Cardiovascular: Negative for chest pain, palpitations and leg swelling.  Gastrointestinal: Negative for abdominal distention and abdominal pain.  Endocrine: Negative.   Genitourinary: Negative.   Musculoskeletal: Negative for back pain and neck pain.  Skin: Negative.   Allergic/Immunologic: Negative.   Neurological: Positive for light-headedness (infrequent) and headaches  (infrequent). Negative for dizziness.  Hematological: Negative for adenopathy. Does not bruise/bleed easily.  Psychiatric/Behavioral: Negative for dysphoric mood and sleep disturbance. The patient is not nervous/anxious.      Physical Exam  Constitutional: She is oriented to person, place, and time. She appears well-developed and well-nourished.  HENT:  Head: Normocephalic and atraumatic.  Neck: Normal range of motion. Neck supple. No JVD present.  Cardiovascular: Normal rate and regular rhythm.  Pulmonary/Chest: Effort normal. She has no wheezes. She has no rales.  Abdominal: Soft. She exhibits no distension. There is no abdominal tenderness.  Musculoskeletal:        General: No tenderness or edema.  Neurological: She is alert and oriented to person, place, and time.  Skin: Skin is warm and dry.  Psychiatric: She has a normal mood and affect. Her behavior is normal. Thought content normal.  Nursing note and vitals reviewed.  Assessment & Plan:  1: Chronic heart failure with preserved ejection fraction- - NYHA class II - euvolemic today - weighing daily and she says that her weight has gradually declined. Instructed to call for an overnight weight gain of >2 pounds or a weekly weight gain of >5 pounds - walking 3 days/ week for ~ 10-15 minutes each time; encouraged her to slowly increase that and work her way up to 30 minutes / day - also chasing grandbaby for exercise - not adding salt to her food. Reminded about  closely following a 2000mg  sodium diet  - BNP on 12/23/17 was 116.0 - she does not take the flu vaccine; encouraged good handwashing  2: DM- - follows with PCP @ 12/25/17 [facility] & returns 09/28/18  3: HTN- - BP  - BMP from 12/25/17 reviewed and showed sodium 138, potassium 3.1 and GFR >60  4: Obstructive sleep apnea- - wearing CPAP inconsistently due to electric bill; now wearing it 3 nights/ week about 4 hours each time - feels better after she  wears it   5: Tobacco use- - + chewing tobacco  - complete cessation discussed for 3 minutes with her  Patient did not bring her medications nor a list. Each medication was verbally reviewed with the patient and she was encouraged to bring the bottles to every visit to confirm accuracy of list.

## 2019-09-26 ENCOUNTER — Ambulatory Visit: Payer: Self-pay | Admitting: Family

## 2019-10-05 NOTE — Progress Notes (Deleted)
Patient ID: Terry Noble, female    DOB: Jan 27, 1966, 53 y.o.   MRN: [id number]  HPI  Terry Noble is a 53 y/o female with a history of DM, HTN, obstructive sleep apnea, current tobacco use (chews) and chronic heart failure.   Echo report from 12/23/17 reviewed and showed an EF of 55%.  Has not been admitted or been in the ED in the last 6 months  She presents today for a follow-up visit with a chief complaint of   Past Medical History:  Diagnosis Date  . CHF (congestive heart failure) ([place])   . Diabetes mellitus without complication ([place])   . Hypertension   . Morbid obesity ([place])   . Sleep apnea    Past Surgical History:  Procedure Laterality Date  . none     Family History  Problem Relation Age of Onset  . Hypertension Mother    Social History   Tobacco Use  . Smoking status: Former Research scientist (life sciences)  . Smokeless tobacco: Current User    Types: Chew  Substance Use Topics  . Alcohol use: No    Frequency: Never   Allergies  Allergen Reactions  . Penicillins Shortness Of Breath    Itching, swelling  . Shellfish Allergy Hives and Shortness Of Breath  . [place]   . Cucumber Extract Hives    pickles  . Lasix [Furosemide] Swelling  . Orange Fruit [Citrus] Hives  . Oxycodone   . Peanut-Containing Drug Products Hives     Review of Systems  Constitutional: Positive for fatigue (improving). Negative for appetite change.  HENT: Negative for congestion, postnasal drip and sore throat.   Eyes: Negative.   Respiratory: Positive for cough (dry cough) and shortness of breath (minimal). Negative for chest tightness.   Cardiovascular: Negative for chest pain, palpitations and leg swelling.  Gastrointestinal: Negative for abdominal distention and abdominal pain.  Endocrine: Negative.   Genitourinary: Negative.   Musculoskeletal: Negative for back pain and neck pain.  Skin: Negative.   Allergic/Immunologic: Negative.   Neurological: Positive for light-headedness (infrequent) and headaches  (infrequent). Negative for dizziness.  Hematological: Negative for adenopathy. Does not bruise/bleed easily.  Psychiatric/Behavioral: Negative for dysphoric mood and sleep disturbance. The patient is not nervous/anxious.      Physical Exam  Constitutional: She is oriented to person, place, and time. She appears well-developed and well-nourished.  HENT:  Head: Normocephalic and atraumatic.  Neck: Normal range of motion. Neck supple. No JVD present.  Cardiovascular: Normal rate and regular rhythm.  Pulmonary/Chest: Effort normal. She has no wheezes. She has no rales.  Abdominal: Soft. She exhibits no distension. There is no abdominal tenderness.  Musculoskeletal:        General: No tenderness or edema.  Neurological: She is alert and oriented to person, place, and time.  Skin: Skin is warm and dry.  Psychiatric: She has a normal mood and affect. Her behavior is normal. Thought content normal.  Nursing note and vitals reviewed.  Assessment & Plan:  1: Chronic heart failure with preserved ejection fraction- - NYHA class II - euvolemic today - weighing daily and she says that her weight has gradually declined. Instructed to call for an overnight weight gain of >2 pounds or a weekly weight gain of >5 pounds - walking 3 days/ week for ~ 10-15 minutes each time; encouraged her to slowly increase that and work her way up to 30 minutes / day - also chasing grandbaby for exercise - not adding salt to her food. Reminded about  closely following a 2000mg  sodium diet  - BNP on 12/23/17 was 116.0 - she does not take the flu vaccine; encouraged good handwashing  2: DM- - follows with PCP @ 12/25/17 [facility] & returns 09/28/18  3: HTN- - BP  - BMP from 12/25/17 reviewed and showed sodium 138, potassium 3.1 and GFR >60  4: Obstructive sleep apnea- - wearing CPAP inconsistently due to electric bill; now wearing it 3 nights/ week about 4 hours each time - feels better after she  wears it   5: Tobacco use- - + chewing tobacco  - complete cessation discussed for 3 minutes with her  Patient did not bring her medications nor a list. Each medication was verbally reviewed with the patient and she was encouraged to bring the bottles to every visit to confirm accuracy of list.

## 2019-10-07 ENCOUNTER — Ambulatory Visit: Payer: Self-pay | Admitting: Family

## 2019-10-18 ENCOUNTER — Other Ambulatory Visit: Payer: Self-pay

## 2019-10-18 ENCOUNTER — Encounter: Payer: Self-pay | Admitting: Family

## 2019-10-18 ENCOUNTER — Ambulatory Visit: Payer: Medicaid Other | Attending: Family | Admitting: Family

## 2019-10-18 VITALS — BP 142/61 | HR 85 | Resp 20 | Ht 65.0 in | Wt 278.0 lb

## 2019-10-18 DIAGNOSIS — E119 Type 2 diabetes mellitus without complications: Secondary | ICD-10-CM | POA: Diagnosis not present

## 2019-10-18 DIAGNOSIS — I5032 Chronic diastolic (congestive) heart failure: Secondary | ICD-10-CM | POA: Diagnosis present

## 2019-10-18 DIAGNOSIS — I110 Hypertensive heart disease with heart failure: Secondary | ICD-10-CM | POA: Insufficient documentation

## 2019-10-18 DIAGNOSIS — Z885 Allergy status to narcotic agent status: Secondary | ICD-10-CM | POA: Diagnosis not present

## 2019-10-18 DIAGNOSIS — Z8249 Family history of ischemic heart disease and other diseases of the circulatory system: Secondary | ICD-10-CM | POA: Diagnosis not present

## 2019-10-18 DIAGNOSIS — Z7984 Long term (current) use of oral hypoglycemic drugs: Secondary | ICD-10-CM | POA: Diagnosis not present

## 2019-10-18 DIAGNOSIS — Z7982 Long term (current) use of aspirin: Secondary | ICD-10-CM | POA: Diagnosis not present

## 2019-10-18 DIAGNOSIS — F17220 Nicotine dependence, chewing tobacco, uncomplicated: Secondary | ICD-10-CM | POA: Insufficient documentation

## 2019-10-18 DIAGNOSIS — Z888 Allergy status to other drugs, medicaments and biological substances status: Secondary | ICD-10-CM | POA: Diagnosis not present

## 2019-10-18 DIAGNOSIS — G4733 Obstructive sleep apnea (adult) (pediatric): Secondary | ICD-10-CM | POA: Diagnosis not present

## 2019-10-18 DIAGNOSIS — Z79899 Other long term (current) drug therapy: Secondary | ICD-10-CM | POA: Insufficient documentation

## 2019-10-18 DIAGNOSIS — Z880 Allergy status to penicillin: Secondary | ICD-10-CM | POA: Diagnosis not present

## 2019-10-18 DIAGNOSIS — I10 Essential (primary) hypertension: Secondary | ICD-10-CM

## 2019-10-18 LAB — GLUCOSE, CAPILLARY: Glucose-Capillary: 126 mg/dL — ABNORMAL HIGH (ref 70–99)

## 2019-10-18 NOTE — Progress Notes (Signed)
Patient ID: Terry Noble, female    DOB: [birth year], 53 y.o.   MRN: [id number]  HPI  Terry Noble is a 53 y/o female with a history of DM, HTN, obstructive sleep apnea, current tobacco use (chews) and chronic heart failure.   Echo report from 12/23/17 reviewed and showed an EF of 55%.  Has not been admitted or been in the ED in the last 6 months  She presents today for a follow-up visit with a chief complaint of moderate fatigue upon minimal exertion. She describes this as chronic in nature having been present for several years. She has associated shortness of breath, pedal edema and headaches along with this. She denies any difficulty sleeping, dizziness, abdominal distention, palpitations, cough or weight gain.   Past Medical History:  Diagnosis Date  . CHF (congestive heart failure) (HCC)   . Diabetes mellitus without complication (HCC)   . Hypertension   . Morbid obesity (HCC)   . Sleep apnea    Past Surgical History:  Procedure Laterality Date  . none     Family History  Problem Relation Age of Onset  . Hypertension Mother    Social History   Tobacco Use  . Smoking status: Former Games developer  . Smokeless tobacco: Current User    Types: Chew  Substance Use Topics  . Alcohol use: No   Allergies  Allergen Reactions  . Penicillins Shortness Of Breath    Itching, swelling  . Shellfish Allergy Hives and Shortness Of Breath  . Cherry   . Cucumber Extract Hives    pickles  . Lasix [Furosemide] Swelling  . Orange Fruit [Citrus] Hives  . Oxycodone   . Peanut-Containing Drug Products Hives   Prior to Admission medications   Medication Sig Start Date End Date Taking? Authorizing Provider  acetaminophen (TYLENOL) 500 MG tablet Take 500 mg by mouth every 6 (six) hours as needed.   Yes [provider]  ARIPiprazole (ABILIFY) 2 MG tablet Take 2 mg by mouth daily.   Yes [provider]  aspirin EC 81 MG tablet Take 81 mg by mouth daily.   Yes [provider]   EPINEPHrine (EPIPEN 2-PAK) 0.3 mg/0.3 mL IJ SOAJ injection Inject 0.3 mg into the muscle once.   Yes [provider]  gabapentin (NEURONTIN) 300 MG capsule Take 300 mg by mouth 2 (two) times daily.    Yes [provider]  lisinopril (PRINIVIL,ZESTRIL) 20 MG tablet Take 20 mg by mouth daily.   Yes [provider]  loratadine (CLARITIN) 10 MG tablet Take 10 mg by mouth daily.   Yes [provider]  Magnesium Bisglycinate Dihyd POWD 200 mg by Does not apply route daily.   Yes [provider]  metFORMIN (GLUCOPHAGE) 1000 MG tablet Take 1,000 mg by mouth 2 (two) times daily with a meal. 1500 mg daily   Yes [provider]  metoprolol succinate (TOPROL-XL) 25 MG 24 hr tablet Take 25 mg by mouth daily.   Yes [provider]  sertraline (ZOLOFT) 25 MG tablet Take 37.5 mg by mouth daily.    Yes [provider]  torsemide (DEMADEX) 20 MG tablet Take 2 tablets (40 mg total) by mouth daily. 04/22/19  Yes Delma Freeze, FNP     Review of Systems  Constitutional: Positive for fatigue (easily). Negative for appetite change.  HENT: Negative for congestion, postnasal drip and sore throat.   Eyes: Negative.   Respiratory: Positive for shortness of breath (minimal). Negative for cough  and chest tightness.   Cardiovascular: Positive for leg swelling (in feet). Negative for chest pain and palpitations.  Gastrointestinal: Negative for abdominal distention and abdominal pain.  Endocrine: Negative.   Genitourinary: Negative.   Musculoskeletal: Negative for back pain and neck pain.  Skin: Negative.   Allergic/Immunologic: Negative.   Neurological: Positive for headaches (infrequent). Negative for dizziness and light-headedness.  Hematological: Negative for adenopathy. Does not bruise/bleed easily.  Psychiatric/Behavioral: Negative for dysphoric mood and sleep disturbance (wearing CPAP ). The patient is not nervous/anxious.    Vitals:    10/18/19 1226  BP: (!) 142/61  Pulse: 85  Resp: 20  SpO2: 100%  Weight: 278 lb (126.1 kg)  Height: 5\' 5"  (1.651 m)   Wt Readings from Last 3 Encounters:  10/18/19 278 lb (126.1 kg)  03/18/19 262 lb (118.8 kg)  09/14/18 269 lb 2 oz (122.1 kg)   Lab Results  Component Value Date   CREATININE 0.97 12/25/2017   CREATININE 0.91 12/24/2017   CREATININE 0.85 12/23/2017    Physical Exam  Constitutional: She is oriented to person, place, and time. She appears well-developed and well-nourished.  HENT:  Head: Normocephalic and atraumatic.  Neck: No JVD present.  Cardiovascular: Normal rate and regular rhythm.  Pulmonary/Chest: Effort normal. She has no wheezes. She has no rales.  Abdominal: Soft. She exhibits no distension. There is no abdominal tenderness.  Musculoskeletal:        General: No tenderness or edema.     Cervical back: Normal range of motion and neck supple.  Neurological: She is alert and oriented to person, place, and time.  Skin: Skin is warm and dry.  Psychiatric: She has a normal mood and affect. Her behavior is normal. Thought content normal.  Nursing note and vitals reviewed.  Assessment & Plan:  1: Chronic heart failure with preserved ejection fraction- - NYHA class III - euvolemic today - weighing daily and she says that her weight ranges from 274-278. Reminded to call for an overnight weight gain of >2 pounds or a weekly weight gain of >5 pounds - continues to walk and look after her grandchild - not adding salt to her food. Reminded about closely following a 2000mg  sodium diet  - saw cardiology Clayborn Bigness) 06/20/2019 - BNP on 12/23/17 was 116.0 - she does not take the flu vaccine; encouraged good handwashing  2: DM- - follows with PCP @ Terry Noble & was seen November 2020 - nonfasting glucose in clinic today was 126  3: HTN- - BP looks good today - BMP from 12/25/17 reviewed and showed sodium 138, potassium 3.1 and GFR >60  4:  Obstructive sleep apnea- - wearing CPAP nightly and says that she feels better after wearing it - says that she takes a nap almost daily but hadn't been wearing her CPAP while napping; instructed her to wear it anytime she goes to sleep   Patient did not bring her medications nor a list. Each medication was verbally reviewed with the patient and she was encouraged to bring the bottles to every visit to confirm accuracy of list.  Will not make a follow-up appointment for patient at this time. Advised her that she could call back at anytime to make another appointment. Patient was comfortable with this plan.

## 2019-10-18 NOTE — Patient Instructions (Addendum)
Continue weighing daily and call for an overnight weight gain of > 2 pounds or a weekly weight gain of >5 pounds.  Call at anytime to make another appointment

## 2019-10-19 ENCOUNTER — Telehealth: Payer: Self-pay | Admitting: Pharmacy Technician

## 2019-10-19 NOTE — Telephone Encounter (Signed)
Patient failed to provide requested 2020 financial documentation.  No additional medication assistance will be provided by [facility] without the required proof of income documentation.  Patient notified by letter.  Shyanna Klingel J. Gennesis Hogland Care Manager [facility] 

## 2021-06-28 ENCOUNTER — Other Ambulatory Visit: Payer: Self-pay | Admitting: Family Medicine

## 2021-06-28 DIAGNOSIS — Z1231 Encounter for screening mammogram for malignant neoplasm of breast: Secondary | ICD-10-CM

## 2021-07-23 ENCOUNTER — Inpatient Hospital Stay: Admission: RE | Admit: 2021-07-23 | Payer: Medicaid Other | Source: Ambulatory Visit

## 2021-08-08 ENCOUNTER — Inpatient Hospital Stay: Admission: RE | Admit: 2021-08-08 | Payer: Medicaid Other | Source: Ambulatory Visit

## 2021-10-30 ENCOUNTER — Inpatient Hospital Stay: Admission: RE | Admit: 2021-10-30 | Payer: Medicaid Other | Source: Ambulatory Visit

## 2021-12-11 DIAGNOSIS — I824Z2 Acute embolism and thrombosis of unspecified deep veins of left distal lower extremity: Secondary | ICD-10-CM | POA: Insufficient documentation

## 2021-12-29 ENCOUNTER — Other Ambulatory Visit: Payer: Self-pay

## 2021-12-29 ENCOUNTER — Inpatient Hospital Stay
Admission: EM | Admit: 2021-12-29 | Discharge: 2022-01-03 | DRG: 546 | Disposition: A | Discharge status: Home / Self Care | Payer: Medicaid Other | Attending: Internal Medicine | Admitting: Internal Medicine

## 2021-12-29 ENCOUNTER — Emergency Department: Payer: Medicaid Other

## 2021-12-29 ENCOUNTER — Observation Stay
Admit: 2021-12-29 | Discharge: 2021-12-29 | Disposition: A | Discharge status: Home / Self Care | Payer: Medicaid Other | Attending: Internal Medicine | Admitting: Internal Medicine

## 2021-12-29 ENCOUNTER — Observation Stay: Payer: Medicaid Other

## 2021-12-29 DIAGNOSIS — B961 Klebsiella pneumoniae [K. pneumoniae] as the cause of diseases classified elsewhere: Secondary | ICD-10-CM | POA: Diagnosis present

## 2021-12-29 DIAGNOSIS — M069 Rheumatoid arthritis, unspecified: Principal | ICD-10-CM | POA: Diagnosis present

## 2021-12-29 DIAGNOSIS — G4733 Obstructive sleep apnea (adult) (pediatric): Secondary | ICD-10-CM | POA: Diagnosis present

## 2021-12-29 DIAGNOSIS — B964 Proteus (mirabilis) (morganii) as the cause of diseases classified elsewhere: Secondary | ICD-10-CM | POA: Diagnosis present

## 2021-12-29 DIAGNOSIS — E1122 Type 2 diabetes mellitus with diabetic chronic kidney disease: Secondary | ICD-10-CM | POA: Diagnosis present

## 2021-12-29 DIAGNOSIS — R000 Tachycardia, unspecified: Secondary | ICD-10-CM | POA: Diagnosis present

## 2021-12-29 DIAGNOSIS — F329 Major depressive disorder, single episode, unspecified: Secondary | ICD-10-CM | POA: Diagnosis present

## 2021-12-29 DIAGNOSIS — Z91013 Allergy to seafood: Secondary | ICD-10-CM

## 2021-12-29 DIAGNOSIS — I5032 Chronic diastolic (congestive) heart failure: Secondary | ICD-10-CM | POA: Diagnosis present

## 2021-12-29 DIAGNOSIS — D509 Iron deficiency anemia, unspecified: Secondary | ICD-10-CM | POA: Diagnosis present

## 2021-12-29 DIAGNOSIS — R531 Weakness: Secondary | ICD-10-CM | POA: Diagnosis not present

## 2021-12-29 DIAGNOSIS — I10 Essential (primary) hypertension: Secondary | ICD-10-CM | POA: Diagnosis present

## 2021-12-29 DIAGNOSIS — Z20822 Contact with and (suspected) exposure to covid-19: Secondary | ICD-10-CM | POA: Diagnosis present

## 2021-12-29 DIAGNOSIS — N183 Chronic kidney disease, stage 3 unspecified: Secondary | ICD-10-CM

## 2021-12-29 DIAGNOSIS — N1832 Chronic kidney disease, stage 3b: Secondary | ICD-10-CM | POA: Diagnosis present

## 2021-12-29 DIAGNOSIS — F32A Depression, unspecified: Secondary | ICD-10-CM | POA: Diagnosis present

## 2021-12-29 DIAGNOSIS — R6510 Systemic inflammatory response syndrome (SIRS) of non-infectious origin without acute organ dysfunction: Secondary | ICD-10-CM | POA: Diagnosis present

## 2021-12-29 DIAGNOSIS — E1140 Type 2 diabetes mellitus with diabetic neuropathy, unspecified: Secondary | ICD-10-CM | POA: Diagnosis present

## 2021-12-29 DIAGNOSIS — I509 Heart failure, unspecified: Secondary | ICD-10-CM

## 2021-12-29 DIAGNOSIS — Z888 Allergy status to other drugs, medicaments and biological substances status: Secondary | ICD-10-CM

## 2021-12-29 DIAGNOSIS — R52 Pain, unspecified: Secondary | ICD-10-CM

## 2021-12-29 DIAGNOSIS — F17220 Nicotine dependence, chewing tobacco, uncomplicated: Secondary | ICD-10-CM | POA: Diagnosis present

## 2021-12-29 DIAGNOSIS — R8271 Bacteriuria: Secondary | ICD-10-CM | POA: Diagnosis present

## 2021-12-29 DIAGNOSIS — R5381 Other malaise: Secondary | ICD-10-CM | POA: Diagnosis present

## 2021-12-29 DIAGNOSIS — E119 Type 2 diabetes mellitus without complications: Secondary | ICD-10-CM

## 2021-12-29 DIAGNOSIS — Z6841 Body Mass Index (BMI) 40.0 and over, adult: Secondary | ICD-10-CM

## 2021-12-29 DIAGNOSIS — Z8249 Family history of ischemic heart disease and other diseases of the circulatory system: Secondary | ICD-10-CM

## 2021-12-29 DIAGNOSIS — E1165 Type 2 diabetes mellitus with hyperglycemia: Secondary | ICD-10-CM | POA: Diagnosis present

## 2021-12-29 DIAGNOSIS — I130 Hypertensive heart and chronic kidney disease with heart failure and stage 1 through stage 4 chronic kidney disease, or unspecified chronic kidney disease: Secondary | ICD-10-CM | POA: Diagnosis present

## 2021-12-29 DIAGNOSIS — I82402 Acute embolism and thrombosis of unspecified deep veins of left lower extremity: Secondary | ICD-10-CM | POA: Diagnosis present

## 2021-12-29 LAB — COMPREHENSIVE METABOLIC PANEL WITH GFR
ALT: 13 U/L (ref 0–44)
AST: 18 U/L (ref 15–41)
Albumin: 3.5 g/dL (ref 3.5–5.0)
Alkaline Phosphatase: 68 U/L (ref 38–126)
Anion gap: 13 (ref 5–15)
BUN: 22 mg/dL — ABNORMAL HIGH (ref 6–20)
CO2: 23 mmol/L (ref 22–32)
Calcium: 9 mg/dL (ref 8.9–10.3)
Chloride: 102 mmol/L (ref 98–111)
Creatinine, Ser: 1.41 mg/dL — ABNORMAL HIGH (ref 0.44–1.00)
GFR, Estimated: 44 mL/min — ABNORMAL LOW
Glucose, Bld: 176 mg/dL — ABNORMAL HIGH (ref 70–99)
Potassium: 4.2 mmol/L (ref 3.5–5.1)
Sodium: 138 mmol/L (ref 135–145)
Total Bilirubin: 0.8 mg/dL (ref 0.3–1.2)
Total Protein: 7.9 g/dL (ref 6.5–8.1)

## 2021-12-29 LAB — CBC WITH DIFFERENTIAL/PLATELET
Abs Immature Granulocytes: 0.12 K/uL — ABNORMAL HIGH (ref 0.00–0.07)
Basophils Absolute: 0.1 K/uL (ref 0.0–0.1)
Basophils Relative: 0 %
Eosinophils Absolute: 0.1 K/uL (ref 0.0–0.5)
Eosinophils Relative: 1 %
HCT: 29 % — ABNORMAL LOW (ref 36.0–46.0)
Hemoglobin: 8.6 g/dL — ABNORMAL LOW (ref 12.0–15.0)
Immature Granulocytes: 1 %
Lymphocytes Relative: 23 %
Lymphs Abs: 3.4 K/uL (ref 0.7–4.0)
MCH: 26 pg (ref 26.0–34.0)
MCHC: 29.7 g/dL — ABNORMAL LOW (ref 30.0–36.0)
MCV: 87.6 fL (ref 80.0–100.0)
Monocytes Absolute: 1 K/uL (ref 0.1–1.0)
Monocytes Relative: 6 %
Neutro Abs: 10.2 K/uL — ABNORMAL HIGH (ref 1.7–7.7)
Neutrophils Relative %: 69 %
Platelets: 383 K/uL (ref 150–400)
RBC: 3.31 MIL/uL — ABNORMAL LOW (ref 3.87–5.11)
RDW: 15.1 % (ref 11.5–15.5)
WBC: 14.8 K/uL — ABNORMAL HIGH (ref 4.0–10.5)
nRBC: 0 % (ref 0.0–0.2)

## 2021-12-29 LAB — URINALYSIS, ROUTINE W REFLEX MICROSCOPIC
Bacteria, UA: NONE SEEN
Bilirubin Urine: NEGATIVE
Glucose, UA: NEGATIVE mg/dL
Hgb urine dipstick: NEGATIVE
Ketones, ur: NEGATIVE mg/dL
Leukocytes,Ua: NEGATIVE
Nitrite: NEGATIVE
Protein, ur: 30 mg/dL — AB
Specific Gravity, Urine: 1.016 (ref 1.005–1.030)
Squamous Epithelial / HPF: NONE SEEN (ref 0–5)
pH: 9 — ABNORMAL HIGH (ref 5.0–8.0)

## 2021-12-29 LAB — CK: Total CK: 51 U/L (ref 38–234)

## 2021-12-29 LAB — LACTIC ACID, PLASMA: Lactic Acid, Venous: 1.5 mmol/L (ref 0.5–1.9)

## 2021-12-29 LAB — GLUCOSE, CAPILLARY
Glucose-Capillary: 116 mg/dL — ABNORMAL HIGH (ref 70–99)
Glucose-Capillary: 122 mg/dL — ABNORMAL HIGH (ref 70–99)

## 2021-12-29 LAB — CBG MONITORING, ED: Glucose-Capillary: 112 mg/dL — ABNORMAL HIGH (ref 70–99)

## 2021-12-29 LAB — TROPONIN I (HIGH SENSITIVITY)
Troponin I (High Sensitivity): 4 ng/L
Troponin I (High Sensitivity): 5 ng/L

## 2021-12-29 LAB — RESP PANEL BY RT-PCR (FLU A&B, COVID) ARPGX2
Influenza A by PCR: NEGATIVE
Influenza B by PCR: NEGATIVE
SARS Coronavirus 2 by RT PCR: NEGATIVE

## 2021-12-29 LAB — HIV ANTIBODY (ROUTINE TESTING W REFLEX): HIV Screen 4th Generation wRfx: NONREACTIVE

## 2021-12-29 MED ORDER — ACETAMINOPHEN 500 MG PO TABS
500.0000 mg | ORAL_TABLET | Freq: Four times a day (QID) | ORAL | Status: DC | PRN
  Administered 2021-12-29 – 2021-12-30 (×2): 500 mg via ORAL
  Filled 2021-12-29 (×2): qty 1

## 2021-12-29 MED ORDER — ARIPIPRAZOLE 2 MG PO TABS
2.0000 mg | ORAL_TABLET | Freq: Every day | ORAL | Status: DC
Start: 2021-12-29 — End: 2022-01-03
  Administered 2021-12-29 – 2022-01-03 (×6): 2 mg via ORAL
  Filled 2021-12-29 (×7): qty 1

## 2021-12-29 MED ORDER — STROKE: EARLY STAGES OF RECOVERY BOOK: Freq: Once | Status: DC

## 2021-12-29 MED ORDER — LORATADINE 10 MG PO TABS
10.0000 mg | ORAL_TABLET | Freq: Every day | ORAL | Status: DC
Start: 2021-12-29 — End: 2022-01-03
  Administered 2021-12-29 – 2022-01-03 (×6): 10 mg via ORAL
  Filled 2021-12-29 (×6): qty 1

## 2021-12-29 MED ORDER — SODIUM CHLORIDE 0.9 % IV BOLUS
500.0000 mL | Freq: Once | INTRAVENOUS | Status: AC
  Administered 2021-12-29: 500 mL via INTRAVENOUS

## 2021-12-29 MED ORDER — GABAPENTIN 300 MG PO CAPS
300.0000 mg | ORAL_CAPSULE | Freq: Two times a day (BID) | ORAL | Status: DC
  Administered 2021-12-29 – 2022-01-03 (×11): 300 mg via ORAL
  Filled 2021-12-29 (×11): qty 1

## 2021-12-29 MED ORDER — ATORVASTATIN CALCIUM 20 MG PO TABS
40.0000 mg | ORAL_TABLET | Freq: Every day | ORAL | Status: DC
  Administered 2021-12-29 – 2022-01-03 (×6): 40 mg via ORAL
  Filled 2021-12-29 (×6): qty 2

## 2021-12-29 MED ORDER — ASPIRIN EC 81 MG PO TBEC
81.0000 mg | DELAYED_RELEASE_TABLET | Freq: Every day | ORAL | Status: DC
Start: 2021-12-29 — End: 2021-12-30
  Administered 2021-12-29 – 2021-12-30 (×2): 81 mg via ORAL
  Filled 2021-12-29 (×2): qty 1

## 2021-12-29 MED ORDER — SERTRALINE HCL 25 MG PO TABS
37.5000 mg | ORAL_TABLET | Freq: Every day | ORAL | Status: DC
  Administered 2021-12-29 – 2022-01-03 (×6): 37.5 mg via ORAL
  Filled 2021-12-29 (×7): qty 1.5

## 2021-12-29 NOTE — Assessment & Plan Note (Signed)
Patient has a history of chronic diastolic dysfunction CHF ?Last known LVEF of 55% ?Hold torsemide, lisinopril and metoprolol until acute stroke is ruled out ?

## 2021-12-29 NOTE — ED Notes (Signed)
Pt return from MRI

## 2021-12-29 NOTE — Progress Notes (Signed)
SLP Cancellation Note ? ?Patient Details ?Name: Terry Noble  ?MRN: [id number] ?DOB: 03/31/1966 ? ? ?Cancelled treatment:       Reason Eval/Treat Not Completed:  SLP evaluation deferred due to pending medical workup. ? ?SLP consult received and appreciated. Per chart review, pt with pending STAT Brain MRI. NIHSS = 0. [facility] Head CT 12/29/21 "no acute intracranial abnormality." Will defer cognitive-linguistic evaluation pending results of MRI as cognitive-linguistic evaluation may not be indicated. ? ?RN aware and in agreement.  ? ?Clyde CanterburyJennifer Drucella Karbowski, M.S., CCC-SLP ?Speech-Language Pathologist ? - [facility] ?[phone] (ASCOM)  ? ?Alessandra BevelsJennifer M Tyhir Schwan ?12/29/2021, 1:33 PM ?

## 2021-12-29 NOTE — ED Notes (Signed)
Pt sleeping. 

## 2021-12-29 NOTE — ED Provider Notes (Signed)
? ?[facility] ?Provider Note ? ? ? Event Date/Time  ? First MD Initiated Contact with Patient 12/29/21 [id number]   ?  (approximate) ? ? ?History  ? ?No chief complaint on file. ? ? ?HPI ? ?Terry Noble is a 56 y.o. female with a history of CHF, diabetes, hypertension, sleep apnea, and obesity who presents ostensibly for allergic reaction, however in reality the patient's main complaint is weakness.  EMS reported that the patient drank Cheerwine soda this morning, is allergic to cherries, had an allergic reaction, and was given an EpiPen by a family member.  However, the patient states that she last drank this a week ago.  The patient states that yesterday she started feeling swelling in both arms and legs, pain in the legs, and some shakiness.  The patient states that this morning she "rolled" out of bed but did not fall and hit her head.  She then was on the floor for an hour and a half and felt too weak to get up.  She had urinary incontinence during this time.  When her daughter found her, she thought that maybe the patient could have had an allergic reaction and gave the EpiPen. ? ?The patient reports worsened weakness to the left leg compared to the right, as well as numbness in the left arm and leg.  She states she feels shaky in her arms but this is also worse on the left.  She states that all the symptoms started last night. ? ? ?Physical Exam  ? ?Triage Vital Signs: ?ED Triage Vitals  ?Enc Vitals Group  ?   BP 12/29/21 0835 137/82  ?   Pulse Rate 12/29/21 0835 (!) 118  ?   Resp 12/29/21 0835 16  ?   Temp 12/29/21 0835 98.7 ?F (37.1 ?C)  ?   Temp Source 12/29/21 0835 Oral  ?   SpO2 12/29/21 0835 96 %  ?   Weight 12/29/21 0837 274 lb (124.3 kg)  ?   Height 12/29/21 0837 5\' 5"  (1.651 m)  ?   Head Circumference --   ?   Peak Flow --   ?   Pain Score 12/29/21 0837 10  ?   Pain Loc --   ?   Pain Edu? --   ?   Excl. in [place]? --   ? ? ?Most recent vital signs: ?Vitals:  ? 12/29/21 0935 12/29/21 0954   ?BP:  (!) 146/71  ?Pulse: (!) 113 (!) 110  ?Resp: (!) 22 (!) 21  ?Temp:    ?SpO2: 92% 99%  ? ? ? ?General: Alert and oriented x4, comfortable appearing. ?CV:  Good peripheral perfusion.  ?Resp:  Normal effort.  ?Abd:  No distention.  ?Other:  4/5 motor strength to LLE, 5/5 strength to all other extremities.  Mild tremor to LUE, no ataxia, no pronator drift.  No facial droop. ? ? ?ED Results / Procedures / Treatments  ? ?Labs ?(all labs ordered are listed, but only abnormal results are displayed) ?Labs Reviewed  ?COMPREHENSIVE METABOLIC PANEL - Abnormal; Notable for the following components:  ?    Result Value  ? Glucose, Bld 176 (*)   ? BUN 22 (*)   ? Creatinine, Ser 1.41 (*)   ? GFR, Estimated 44 (*)   ? All other components within normal limits  ?CBC WITH DIFFERENTIAL/PLATELET - Abnormal; Notable for the following components:  ? WBC 14.8 (*)   ? RBC 3.31 (*)   ? Hemoglobin 8.6 (*)   ?  HCT 29.0 (*)   ? MCHC 29.7 (*)   ? Neutro Abs 10.2 (*)   ? Abs Immature Granulocytes 0.12 (*)   ? All other components within normal limits  ?URINALYSIS, ROUTINE W REFLEX MICROSCOPIC - Abnormal; Notable for the following components:  ? Color, Urine YELLOW (*)   ? APPearance HAZY (*)   ? pH 9.0 (*)   ? Protein, ur 30 (*)   ? All other components within normal limits  ?RESP PANEL BY RT-PCR (FLU A&B, COVID) [id number]  ?LACTIC ACID, PLASMA  ?CK  ?TROPONIN I (HIGH SENSITIVITY)  ?TROPONIN I (HIGH SENSITIVITY)  ? ? ? ?EKG ? ?ED ECG REPORT ?IArta Silence, the attending physician, personally viewed and interpreted this ECG. ? ?Date: 12/29/2021 ?EKG Time: [id number] ?Rate: 118 ?Rhythm: Sinus tachycardia ?QRS Axis: normal ?Intervals: normal ?ST/T Wave abnormalities: normal ?Narrative Interpretation: no evidence of acute ischemia ? ? ? ?RADIOLOGY ? ?CT head: I independently viewed and interpreted the images; there is no ICH or evidence of acute stroke ? ?PROCEDURES: ? ?Critical Care performed: No ? ?Procedures ? ? ?MEDICATIONS ORDERED IN  ED: ?Medications  ?sodium chloride 0.9 % bolus 500 mL (500 mLs Intravenous New Bag/Given 12/29/21 0956)  ? ? ? ?IMPRESSION / MDM / ASSESSMENT AND PLAN / ED COURSE  ?I reviewed the triage vital signs and the nursing notes. ? ?56 year old female with PMH as noted above presents with weakness since last night, generalized but worse in the left arm and leg and associated with shakiness and urinary incontinence.  The patient rolled out of her bed this morning and was unable to get up off of the floor.  Her daughter thought she might be having an allergic reaction and gave an EpiPen, however the patient has no recent allergic exposures and denies specific allergic type symptoms ? ?On exam, the vital signs are normal except for tachycardia, which I suspect is due to the epinephrine.  Physical exam is significant for tremor in the LUE, and weakness in the LLE compared to the other extremities.  There is no facial droop or other focal neurologic findings. ? ?Differential diagnosis includes, but is not limited to, CVA, TIA, electrolyte abnormality or other metabolic cause, UTI or other infection, rhabdomyolysis.  We will obtain CT head, lab work-up, and reassess.  The patient is out of the window for code stroke activation and does not require emergent neurology consult. ? ?The patient is on the cardiac monitor to evaluate for evidence of arrhythmia and/or significant heart rate changes. ? ?----------------------------------------- ?11:06 AM on 12/29/2021 ?----------------------------------------- ? ?CT head is negative for acute findings.  The lab work-up is also unremarkable except for leukocytosis.  Respiratory panel is negative.  CK is normal.  Urinalysis does not show evidence of UTI.  The patient does endorse some shortness of breath but no cough.  I have added on a chest x-ray to rule out pneumonia.  The patient will need further work-up to rule out stroke.  I consulted Dr. Marthenia Rolling from the hospitalist service; based on  our discussion she agrees to admit the patient ? ?FINAL CLINICAL IMPRESSION(S) / ED DIAGNOSES  ? ?Final diagnoses:  ?Weakness  ? ? ? ?Rx / DC Orders  ? ?ED Discharge Orders   ? ? None  ? ?  ? ? ? ?Note:  This document was prepared using Dragon voice recognition software and may include unintentional dictation errors.  ?  Arta Silence, MD ?12/29/21 1107 ? ?

## 2021-12-29 NOTE — Assessment & Plan Note (Signed)
Hold metformin ?Blood sugar checks every 4 hours ?

## 2021-12-29 NOTE — ED Notes (Signed)
Request made for transport to the floor ?

## 2021-12-29 NOTE — Assessment & Plan Note (Signed)
Patient's BMI is 45.60 kg/m2 ?Complicates overall prognosis and care ?Lifestyle modification and exercise has been discussed with her in detail ?

## 2021-12-29 NOTE — Assessment & Plan Note (Signed)
Secondary to morbid obesity CPAP at bedtime   

## 2021-12-29 NOTE — ED Notes (Signed)
Assumed pt care. Pt currently ao, asking for food. Informed pt that she's still NPO at the moment, pt verbalized understanding.  ?

## 2021-12-29 NOTE — Assessment & Plan Note (Signed)
Patient presents for evaluation of left-sided weakness which she has had for 2 days concerning for possible stroke. ?Initial CT scan of the head without contrast is negative for bleed. ?Obtain MRI of the brain without contrast to rule out an acute stroke ?Obtain 2D echocardiogram to assess for cardiac thrombus ?Allow for permissive hypertension until an acute stroke is ruled out ?Place patient on aspirin and statins ?We will request PT/OT/ST consult ?

## 2021-12-29 NOTE — Assessment & Plan Note (Signed)
Stable. ?Continue sertraline and Abilify ?

## 2021-12-29 NOTE — ED Notes (Signed)
Patient transported to MRI 

## 2021-12-29 NOTE — ED Notes (Signed)
Pt in CT.

## 2021-12-29 NOTE — H&P (Signed)
?History and Physical  ? ? ?PatientNeriah Noble QQV:[id number] DOB: 1966-08-10 ?DOA: 12/29/2021 ?DOS: the patient was seen and examined on 12/29/2021 ?PCP: Oswaldo Conroy, MD  ?Patient coming from: Home ? ?Chief Complaint: No chief complaint on file. ? ? ?HPI: Terry Noble is a 56 y.o. female with medical history significant for morbid obesity, hypertension, diabetes mellitus, sleep apnea and CHF who presents to the ER for evaluation of a 2-day history of left-sided weakness. ?Patient notes that she has had numbness and tingling in her left upper and lower extremity since Friday and thought it was an allergic reaction.  She took her EpiPen and Benadryl without any improvement and so she called EMS. ?She denies having any difficulty swallowing or slurred speech.  She complains of blurred vision but denies having any headache, no dizziness, no lightheadedness, no nausea or vomiting.  She states that she rolled out of bed this morning and landed on the floor and could not get up due to the weakness.  She also voided on herself because she was too weak to get up. ?She denies having any chest pain, no shortness of breath, no changes in her bowel habits, no back pain, no fever, no cough, no chills, no urinary symptoms. ?Initial CT scan of the head without contrast does not show an acute bleed. ?Review of Systems: As mentioned in the history of present illness. All other systems reviewed and are negative. ?Past Medical History:  ?Diagnosis Date  ? CHF (congestive heart failure) (HCC)   ? Diabetes mellitus without complication (HCC)   ? Hypertension   ? Morbid obesity (HCC)   ? Sleep apnea   ? ?Past Surgical History:  ?Procedure Laterality Date  ? none    ? ?Social History:  reports that she has quit smoking. Her smokeless tobacco use includes chew. She reports that she does not drink alcohol and does not use drugs. ? ?Allergies  ?Allergen Reactions  ? Penicillins Shortness Of Breath  ?  Itching, swelling  ? Shellfish  Allergy Hives and Shortness Of Breath  ? Cherry   ? Cucumber Extract Hives  ?  pickles  ? Lasix [Furosemide] Swelling  ? Orange Fruit [Citrus] Hives  ? Oxycodone   ? Peanut-Containing Drug Products Hives  ? ? ?Family History  ?Problem Relation Age of Onset  ? Hypertension Mother   ? ? ?Prior to Admission medications   ?Medication Sig Start Date End Date Taking? Authorizing Provider  ?acetaminophen (TYLENOL) 500 MG tablet Take 500 mg by mouth every 6 (six) hours as needed.    [provider]  ?ARIPiprazole (ABILIFY) 2 MG tablet Take 2 mg by mouth daily.    [provider]  ?aspirin EC 81 MG tablet Take 81 mg by mouth daily.    [provider]  ?EPINEPHrine (EPIPEN 2-PAK) 0.3 mg/0.3 mL IJ SOAJ injection Inject 0.3 mg into the muscle once.    [provider]  ?gabapentin (NEURONTIN) 300 MG capsule Take 300 mg by mouth 2 (two) times daily.     [provider]  ?lisinopril (PRINIVIL,ZESTRIL) 20 MG tablet Take 20 mg by mouth daily.    [provider]  ?loratadine (CLARITIN) 10 MG tablet Take 10 mg by mouth daily.    [provider]  ?Magnesium Bisglycinate Dihyd POWD 200 mg by Does not apply route daily.    [provider]  ?metFORMIN (GLUCOPHAGE) 1000 MG tablet Take 1,000 mg by mouth 2 (two) times daily with a meal. 1500 mg  daily    [provider]  ?metoprolol succinate (TOPROL-XL) 25 MG 24 hr tablet Take 25 mg by mouth daily.    [provider]  ?sertraline (ZOLOFT) 25 MG tablet Take 37.5 mg by mouth daily.     [provider]  ?torsemide (DEMADEX) 20 MG tablet Take 2 tablets (40 mg total) by mouth daily. 04/22/19   Delma Freeze, FNP  ? ? ?Physical Exam: ?Vitals:  ? 12/29/21 9604 12/29/21 0845 12/29/21 0935 12/29/21 5409  ?BP:    (!) 146/71  ?Pulse:  (!) 116 (!) 113 (!) 110  ?Resp:  (!) 21 (!) 22 (!) 21  ?Temp:      ?TempSrc:      ?SpO2:  97% 92% 99%  ?Weight: 124.3 kg     ?Height:  (1.651 m)     ? ?Physical  Exam ?Vitals and nursing note reviewed.  ?Constitutional:   ?   Appearance: Normal appearance. She is obese.  ?HENT:  ?   Head: Normocephalic.  ?   Nose: Nose normal.  ?   Mouth/Throat:  ?   Mouth: Mucous membranes are moist.  ?Eyes:  ?   Pupils: Pupils are equal, round, and reactive to light.  ?Cardiovascular:  ?   Rate and Rhythm: Tachycardia present.  ?Pulmonary:  ?   Effort: Pulmonary effort is normal.  ?   Breath sounds: Normal breath sounds.  ?Abdominal:  ?   General: Bowel sounds are normal.  ?   Palpations: Abdomen is soft.  ?   Comments: Central adiposity  ?Musculoskeletal:     ?   General: Normal range of motion.  ?   Cervical back: Normal range of motion and neck supple.  ?Skin: ?   General: Skin is warm and dry.  ?Neurological:  ?   Mental Status: She is alert and oriented to person, place, and time.  ?   Comments: Left-sided weakness.  Strength is 3/5 in the left upper extremity and 2/5 in the left lower extremity  ? ? ? ?Data Reviewed: ?Relevant notes from primary care and specialist visits, past discharge summaries as available in EHR, including Care Everywhere. ?Prior diagnostic testing as pertinent to current admission diagnoses ?Updated medications and problem lists for reconciliation ?ED course, including vitals, labs, imaging, treatment and response to treatment ?Triage notes, nursing and pharmacy notes and ED provider's notes ?Notable results as noted in HPI ?Urine analysis shows some proteinuria.  Glucose 176, BUN 22, creatinine 1.41, total CK 51, white count 14.8, hemoglobin 8.6, hematocrit 29.0, RDW 15.1, platelet count 383 ?CT scan of the head without contrast shows no acute abnormality ?Chest x-ray reviewed by me shows clear lungs ?Twelve-lead EKG shows sinus tachycardia ?There are no new results to review at this time. ? ?Assessment and Plan: ?* Weakness ?Patient presents for evaluation of left-sided weakness which she has had for 2 days concerning for possible stroke. ?Initial CT scan of  the head without contrast is negative for bleed. ?Obtain MRI of the brain without contrast to rule out an acute stroke ?Obtain 2D echocardiogram to assess for cardiac thrombus ?Allow for permissive hypertension until an acute stroke is ruled out ?Place patient on aspirin and statins ?We will request PT/OT/ST consult ? ?Depression ?Stable. ?Continue sertraline and Abilify ? ?Morbid obesity (HCC) ?Patient's BMI is 45.60 kg/m2 ?Complicates overall prognosis and care ?Lifestyle modification and exercise has been discussed with her in detail ? ?Obstructive sleep apnea ?Secondary to morbid obesity ?CPAP at bedtime ? ?HTN (  hypertension) ?Will allow for permissive hypertension until an acute stroke is ruled out ?Hold metoprolol and lisinopril ? ?Diabetes (HCC) ?Hold metformin ?Blood sugar checks every 4 hours ? ?CHF (congestive heart failure) (HCC) ?Patient has a history of chronic diastolic dysfunction CHF ?Last known LVEF of 55% ?Hold torsemide, lisinopril and metoprolol until acute stroke is ruled out ? ? ? ? ? ? ?Advance Care Planning:   Code Status: Full Code  ? ?Consults: PT/OT/ST ? ?Family Communication: Greater than 50% of the time was spent discussing patient's condition and plan of care with her at the bedside.  All questions and concerns have been addressed.  She verbalizes understanding and agrees with the plan. ? ?Severity of Illness: ?The appropriate patient status for this patient is OBSERVATION. Observation status is judged to be reasonable and necessary in order to provide the required intensity of service to ensure the patient's safety. The patient's presenting symptoms, physical exam findings, and initial radiographic and laboratory data in the context of their medical condition is felt to place them at decreased risk for further clinical deterioration. Furthermore, it is anticipated that the patient will be medically stable for discharge from the hospital within 2 midnights of admission.   ? ?Author: ?Lucile Shuttersochukwu Venita Seng, MD ?12/29/2021 12:07 PM ? ?For on call review [email or web address].  ?

## 2021-12-29 NOTE — Assessment & Plan Note (Signed)
Will allow for permissive hypertension until an acute stroke is ruled out ?Hold metoprolol and lisinopril ?

## 2021-12-29 NOTE — Progress Notes (Signed)
*  PRELIMINARY RESULTS* ?Echocardiogram ?2D Echocardiogram has been performed. ? ?Terry Noble ?12/29/2021, 3:17 PM ?

## 2021-12-29 NOTE — ED Triage Notes (Signed)
Pt drank cheerwine 1 week ago, allergic to cherries. Pt states had allergic rx, when asked what happened, states "I just started peeing on myself" and "arms were shaking" and was sweating. Pt took epipen at home. ? ?Pt was ST per EMS ?No difficulty breathing, all VS wnl. ? ?Pt in NAD, no problem breathing, states needs to urinate. Pt is alert and oriented. Also complains of bilateral leg swelling. Does not appear to have had any allergic rx. Pt also states took benadryl last night for swelling in legs which she now states started yesterday. ? ?Pt also states this morning she rolled out of bed and could not get up off floor and urinated on self because was too weak to get up. ? ?Pt states main problem now is feels weak and legs hurt. EDP examining pt. Pt also stating upon questioning that feels numbness in L arm and leg since yesterday. ? ? ?

## 2021-12-30 ENCOUNTER — Inpatient Hospital Stay: Payer: Medicaid Other

## 2021-12-30 DIAGNOSIS — B961 Klebsiella pneumoniae [K. pneumoniae] as the cause of diseases classified elsewhere: Secondary | ICD-10-CM | POA: Diagnosis present

## 2021-12-30 DIAGNOSIS — Z20822 Contact with and (suspected) exposure to covid-19: Secondary | ICD-10-CM | POA: Diagnosis present

## 2021-12-30 DIAGNOSIS — R5381 Other malaise: Secondary | ICD-10-CM | POA: Diagnosis present

## 2021-12-30 DIAGNOSIS — I82402 Acute embolism and thrombosis of unspecified deep veins of left lower extremity: Secondary | ICD-10-CM | POA: Diagnosis present

## 2021-12-30 DIAGNOSIS — I130 Hypertensive heart and chronic kidney disease with heart failure and stage 1 through stage 4 chronic kidney disease, or unspecified chronic kidney disease: Secondary | ICD-10-CM | POA: Diagnosis present

## 2021-12-30 DIAGNOSIS — R8271 Bacteriuria: Secondary | ICD-10-CM | POA: Diagnosis present

## 2021-12-30 DIAGNOSIS — Z8249 Family history of ischemic heart disease and other diseases of the circulatory system: Secondary | ICD-10-CM | POA: Diagnosis not present

## 2021-12-30 DIAGNOSIS — N1832 Chronic kidney disease, stage 3b: Secondary | ICD-10-CM | POA: Diagnosis present

## 2021-12-30 DIAGNOSIS — R6510 Systemic inflammatory response syndrome (SIRS) of non-infectious origin without acute organ dysfunction: Secondary | ICD-10-CM | POA: Diagnosis present

## 2021-12-30 DIAGNOSIS — B964 Proteus (mirabilis) (morganii) as the cause of diseases classified elsewhere: Secondary | ICD-10-CM | POA: Diagnosis present

## 2021-12-30 DIAGNOSIS — Z6841 Body Mass Index (BMI) 40.0 and over, adult: Secondary | ICD-10-CM | POA: Diagnosis not present

## 2021-12-30 DIAGNOSIS — E1122 Type 2 diabetes mellitus with diabetic chronic kidney disease: Secondary | ICD-10-CM | POA: Diagnosis present

## 2021-12-30 DIAGNOSIS — G4733 Obstructive sleep apnea (adult) (pediatric): Secondary | ICD-10-CM | POA: Diagnosis present

## 2021-12-30 DIAGNOSIS — I5032 Chronic diastolic (congestive) heart failure: Secondary | ICD-10-CM | POA: Diagnosis present

## 2021-12-30 DIAGNOSIS — Z888 Allergy status to other drugs, medicaments and biological substances status: Secondary | ICD-10-CM | POA: Diagnosis not present

## 2021-12-30 DIAGNOSIS — D509 Iron deficiency anemia, unspecified: Secondary | ICD-10-CM | POA: Diagnosis present

## 2021-12-30 DIAGNOSIS — E1140 Type 2 diabetes mellitus with diabetic neuropathy, unspecified: Secondary | ICD-10-CM | POA: Diagnosis present

## 2021-12-30 DIAGNOSIS — F17220 Nicotine dependence, chewing tobacco, uncomplicated: Secondary | ICD-10-CM | POA: Diagnosis present

## 2021-12-30 DIAGNOSIS — R000 Tachycardia, unspecified: Secondary | ICD-10-CM | POA: Diagnosis present

## 2021-12-30 DIAGNOSIS — F329 Major depressive disorder, single episode, unspecified: Secondary | ICD-10-CM | POA: Diagnosis present

## 2021-12-30 DIAGNOSIS — Z91013 Allergy to seafood: Secondary | ICD-10-CM | POA: Diagnosis not present

## 2021-12-30 DIAGNOSIS — N183 Chronic kidney disease, stage 3 unspecified: Secondary | ICD-10-CM

## 2021-12-30 DIAGNOSIS — R531 Weakness: Secondary | ICD-10-CM | POA: Diagnosis present

## 2021-12-30 DIAGNOSIS — E1165 Type 2 diabetes mellitus with hyperglycemia: Secondary | ICD-10-CM | POA: Diagnosis present

## 2021-12-30 DIAGNOSIS — M069 Rheumatoid arthritis, unspecified: Secondary | ICD-10-CM | POA: Diagnosis not present

## 2021-12-30 LAB — IRON AND TIBC
Iron: 20 ug/dL — ABNORMAL LOW (ref 28–170)
Saturation Ratios: 7 % — ABNORMAL LOW (ref 10.4–31.8)
TIBC: 281 ug/dL (ref 250–450)
UIBC: 261 ug/dL

## 2021-12-30 LAB — GLUCOSE, CAPILLARY
Glucose-Capillary: 128 mg/dL — ABNORMAL HIGH (ref 70–99)
Glucose-Capillary: 122 mg/dL — ABNORMAL HIGH (ref 70–99)
Glucose-Capillary: 118 mg/dL — ABNORMAL HIGH (ref 70–99)
Glucose-Capillary: 147 mg/dL — ABNORMAL HIGH (ref 70–99)
Glucose-Capillary: 124 mg/dL — ABNORMAL HIGH (ref 70–99)

## 2021-12-30 LAB — ECHOCARDIOGRAM COMPLETE
AR max vel: 2.52 cm2
AV Peak grad: 10.6 mmHg
Ao pk vel: 1.63 m/s
Area-P 1/2: 20.5 cm2
Height: 65 in
S' Lateral: 3.72 cm
Weight: 4384 oz

## 2021-12-30 LAB — HEPARIN LEVEL (UNFRACTIONATED): Heparin Unfractionated: 0.92 IU/mL — ABNORMAL HIGH (ref 0.30–0.70)

## 2021-12-30 LAB — LIPID PANEL
Cholesterol: 136 mg/dL (ref 0–200)
HDL: 34 mg/dL — ABNORMAL LOW (ref >40)
LDL Cholesterol: 86 mg/dL (ref 0–99)
Total CHOL/HDL Ratio: 4 RATIO
Triglycerides: 78 mg/dL (ref <150)
VLDL: 16 mg/dL (ref 0–40)

## 2021-12-30 LAB — SEDIMENTATION RATE: Sed Rate: 127 mm/hr — ABNORMAL HIGH (ref 0–30)

## 2021-12-30 LAB — PROTIME-INR
INR: 1.2 (ref 0.8–1.2)
Prothrombin Time: 15.4 seconds — ABNORMAL HIGH (ref 11.4–15.2)

## 2021-12-30 LAB — URIC ACID: Uric Acid, Serum: 9.7 mg/dL — ABNORMAL HIGH (ref 2.5–7.1)

## 2021-12-30 LAB — HEMOGLOBIN A1C
Hgb A1c MFr Bld: 11.8 % — ABNORMAL HIGH (ref 4.8–5.6)
Mean Plasma Glucose: 292 mg/dL

## 2021-12-30 LAB — C-REACTIVE PROTEIN: CRP: 21.4 mg/dL — ABNORMAL HIGH (ref <1.0)

## 2021-12-30 LAB — APTT: aPTT: 35 seconds (ref 24–36)

## 2021-12-30 LAB — FERRITIN: Ferritin: 211 ng/mL (ref 11–307)

## 2021-12-30 MED ORDER — INSULIN GLARGINE-YFGN 100 UNIT/ML ~~LOC~~ SOLN
25.0000 [IU] | Freq: Every day | SUBCUTANEOUS | Status: DC
  Administered 2021-12-30 – 2022-01-02 (×4): 25 [IU] via SUBCUTANEOUS
  Filled 2021-12-30 (×5): qty 0.25

## 2021-12-30 MED ORDER — INSULIN ASPART 100 UNIT/ML IJ SOLN
0.0000 [IU] | Freq: Every day | INTRAMUSCULAR | Status: DC
  Administered 2022-01-02: 22:00:00 2 [IU] via SUBCUTANEOUS
  Filled 2021-12-30: qty 1

## 2021-12-30 MED ORDER — ACETAMINOPHEN 500 MG PO TABS: 1000.0000 mg | ORAL_TABLET | Freq: Four times a day (QID) | ORAL | Status: DC | PRN

## 2021-12-30 MED ORDER — SODIUM CHLORIDE 0.9 % IV SOLN: INTRAVENOUS | Status: DC

## 2021-12-30 MED ORDER — METOPROLOL SUCCINATE ER 25 MG PO TB24
25.0000 mg | ORAL_TABLET | Freq: Every day | ORAL | Status: DC
  Administered 2021-12-30 – 2022-01-03 (×5): 25 mg via ORAL
  Filled 2021-12-30 (×5): qty 1

## 2021-12-30 MED ORDER — PANTOPRAZOLE SODIUM 40 MG PO TBEC
40.0000 mg | DELAYED_RELEASE_TABLET | Freq: Every day | ORAL | Status: DC
  Administered 2021-12-31 – 2022-01-03 (×4): 40 mg via ORAL
  Filled 2021-12-30 (×4): qty 1

## 2021-12-30 MED ORDER — HYDROCODONE-ACETAMINOPHEN 5-325 MG PO TABS
1.0000 | ORAL_TABLET | Freq: Four times a day (QID) | ORAL | Status: DC | PRN
Start: 2021-12-30 — End: 2022-01-01
  Administered 2021-12-30 – 2021-12-31 (×3): 1 via ORAL
  Filled 2021-12-30 (×3): qty 1

## 2021-12-30 MED ORDER — INSULIN ASPART 100 UNIT/ML IJ SOLN
0.0000 [IU] | Freq: Three times a day (TID) | INTRAMUSCULAR | Status: DC
  Administered 2021-12-30 – 2022-01-02 (×6): 2 [IU] via SUBCUTANEOUS
  Administered 2022-01-02: 17:00:00 3 [IU] via SUBCUTANEOUS
  Administered 2022-01-02: 12:00:00 2 [IU] via SUBCUTANEOUS
  Filled 2021-12-30 (×9): qty 1

## 2021-12-30 MED ORDER — LISINOPRIL 20 MG PO TABS
20.0000 mg | ORAL_TABLET | Freq: Every day | ORAL | Status: DC
  Administered 2021-12-30 – 2022-01-03 (×5): 20 mg via ORAL
  Filled 2021-12-30 (×5): qty 1

## 2021-12-30 MED ORDER — HEPARIN (PORCINE) 25000 UT/250ML-% IV SOLN
1400.0000 [IU]/h | INTRAVENOUS | Status: DC
  Administered 2021-12-30: 1400 [IU]/h via INTRAVENOUS
  Filled 2021-12-30: qty 250

## 2021-12-30 MED ORDER — APIXABAN 5 MG PO TABS
5.0000 mg | ORAL_TABLET | Freq: Two times a day (BID) | ORAL | Status: DC
  Administered 2021-12-30 – 2022-01-03 (×8): 5 mg via ORAL
  Filled 2021-12-30 (×8): qty 1

## 2021-12-30 NOTE — Progress Notes (Addendum)
ANTICOAGULATION CONSULT NOTE ? ?Pharmacy Consult for heparin infusion ?Indication: VTE treatment ? ?Allergies  ?Allergen Reactions  ? Penicillins Shortness Of Breath  ?  Itching, swelling  ? Shellfish Allergy Hives and Shortness Of Breath  ? Cherry   ? Cucumber Extract Hives  ?  pickles  ? Lasix [Furosemide] Swelling  ? Orange Fruit [Citrus] Hives  ? Oxycodone   ? Peanut-Containing Drug Products Hives  ? ? ?Patient Measurements: ?Height: 5\' 5"  (165.1 cm) ?Weight: 120.3 kg (265 lb 3.4 oz) ?IBW/kg (Calculated) : 57 ?Heparin Dosing Weight: 86 kg ? ?Vital Signs: ?Temp: 100.3 ?F (37.9 ?C) (03/06 4037) ?Temp Source: Oral (03/05 2324) ?BP: 158/84 (03/06 0964) ?Pulse Rate: 121 (03/06 0808) ? ?Labs: ?Recent Labs  ?  12/29/21 ?3838 12/29/21 ?1432  ?HGB 8.6*  --   ?HCT 29.0*  --   ?PLT 383  --   ?CREATININE 1.41*  --   ?CKTOTAL 51  --   ?TROPONINIHS 4 5  ? ? ?Estimated Creatinine Clearance: 59.3 mL/min (A) (by C-G formula based on SCr of 1.41 mg/dL (H)). ? ? ?Medical History: ?Past Medical History:  ?Diagnosis Date  ? CHF (congestive heart failure) (HCC)   ? Diabetes mellitus without complication (HCC)   ? Hypertension   ? Morbid obesity (HCC)   ? Sleep apnea   ? ? ?Medications:  ?Eliquis 5 mg BID - last dose reported 3/4 1800 ? ?Assessment: ?56 y.o. female with medical history significant for morbid obesity, hypertension, diabetes mellitus, sleep apnea and CHF who presented to the ER for left-sided weakness. Pt had recent diagnosis of DVT and was on Eliquis prior to admission. Pharmacy has been consulted for heparin dosing and monitoring.  ? ?Baseline labs: Hgb 8.6, HCT 29, Plt wnl; aPTT, PT-INR and heparin level pending ? ?Goal of Therapy:  ?Heparin level 0.3-0.7 units/ml ?aPTT 66-102 seconds ?Monitor platelets by anticoagulation protocol: Yes ?  ?Plan:  ?Will not give initial bolus and will start heparin infusion at 1400 units/hr ?Check aPTT level in 6 hours and daily while on heparin. Heparin level pending. Anticipate will  be elevated with recent Eliquis dose. Will monitor aPTT levels for now and switch to heparin levels once correlating with aPTT ?Continue to monitor H&H and platelets ? ?Kamilo Och O Kennedy Bohanon ?12/30/2021,10:42 AM ? ? ?

## 2021-12-30 NOTE — Evaluation (Signed)
Occupational Therapy Evaluation Patient Details Name: Terry Noble MRN: [id number] DOB: [birth year] Today's Date: 12/30/2021   History of Present Illness Terry Noble is a 56 y.o. female with medical history significant for morbid obesity, hypertension, diabetes mellitus, sleep apnea and CHF who presents to the ER for evaluation of a 2-day history of left-sided weakness. She denies having any difficulty swallowing or slurred speech.  She complains of blurred vision but denies having any headache, no dizziness, no lightheadedness, no nausea or vomiting.  She reports that after 2 days of weakness, she rolled out of bed and landed on the floor and could not get up due to the weakness, so called EMS. She denies having any chest pain, no shortness of breath, no changes in her bowel habits, no back pain, no fever, no cough, no chills, no urinary symptoms. Initial CT scan of the head without contrast does not show an acute bleed.   Clinical Impression   Terry Noble presents with generalized weakness, limited endurance, 8/10 pain in bilateral upper and lower extremities, and impaired balance. Pt is alert, oriented, and pleasant, although has a difficult time describing her present condition, PMHx, and PLOF. She reports that she lives in [place] apt with her daughter and that she has 17 STE. She uses a RW and reports being IND in ADL, with daughter handling or assisting with all IADL. She reports that she has a difficult time managing her diabetes, including monitoring her blood glucose levels, taking her insulin, and eating as recommended by her health care providers. She states that she was recently hospitalized at [facility] 2/2 her glucose levels being > 900. Pt had reported yesterday to ED with left-sided weakness and no pain, but today demonstrates weakness in bilateral UE and LE and reports 7/10 pain in all extremities at rest and 8-9/10 pain with the slightest movement. She declines OOB activity, citing pain, but  is receptive to bed level rolling, repositioning, grooming, and mild UE therex. Recommend ongoing OT during hospitalization, followed by to DC to SNF, given pt's significant care needs at present and her large remove from PLOF.    Recommendations for follow up therapy are one component of a multi-disciplinary discharge planning process, led by the attending physician.  Recommendations may be updated based on patient status, additional functional criteria and insurance authorization.   Follow Up Recommendations       Assistance Recommended at Discharge Frequent or constant Supervision/Assistance  Patient can return home with the following A lot of help with walking and/or transfers;A lot of help with bathing/dressing/bathroom;Assistance with cooking/housework;Assist for transportation;Help with stairs or ramp for entrance    Functional Status Assessment  Patient has had a recent decline in their functional status and demonstrates the ability to make significant improvements in function in a reasonable and predictable amount of time.  Equipment Recommendations  None recommended by OT    Recommendations for Other Services       Precautions / Restrictions Precautions Precautions: Fall Restrictions Weight Bearing Restrictions: No      Mobility Bed Mobility Overal bed mobility: Needs Assistance Bed Mobility: Rolling Rolling: Mod assist              Transfers                   General transfer comment: unable; pt declines, citing pain      Balance Overall balance assessment: Needs assistance           Standing balance-Leahy Scale: Zero Standing  balance comment: pt declines standing                           ADL either performed or assessed with clinical judgement   ADL Overall ADL's : Needs assistance/impaired     Grooming: Bed level;Maximal assistance                                 General ADL Comments: Max A +2 for fxl  mobility tasks     Vision Patient Visual Report: No change from baseline       Perception     Praxis      Pertinent Vitals/Pain Pain Assessment Pain Score: 8  Pain Location: bilateral UE and LE Pain Descriptors / Indicators: Grimacing, Guarding, Pounding, Moaning Pain Intervention(s): Limited activity within patient's tolerance, Monitored during session, Repositioned     Hand Dominance     Extremity/Trunk Assessment Upper Extremity Assessment Upper Extremity Assessment: Generalized weakness RUE: Unable to fully assess due to pain RUE Sensation: WNL RUE Coordination: WNL LUE: Unable to fully assess due to pain LUE Sensation: WNL LUE Coordination: WNL   Lower Extremity Assessment Lower Extremity Assessment: Generalized weakness RLE: Unable to fully assess due to pain RLE Sensation: WNL RLE Coordination: WNL LLE: Unable to fully assess due to pain LLE Sensation: WNL LLE Coordination: WNL       Communication Communication Communication: No difficulties   Cognition Arousal/Alertness: Awake/alert Behavior During Therapy: WFL for tasks assessed/performed, Anxious Overall Cognitive Status: Within Functional Limits for tasks assessed                                 General Comments: anxious about any movement; has some difficult describing her PMHx and current concerns     General Comments       Exercises Other Exercises Other Exercises: Educ re: repositioning for comfort and to prevent skin breakdown, importance of OOB mobility   Shoulder Instructions      Home Living Family/patient expects to be discharged to:: Private residence Living Arrangements: Children Available Help at Discharge: Family;Available 24 hours/day Type of Home: Apartment Home Access: Stairs to enter Entergy CorporationEntrance Stairs-Number of Steps: 17 Entrance Stairs-Rails: Left;Right Home Layout: One level     Bathroom Shower/Tub: Chief Strategy OfficerTub/shower unit   Bathroom Toilet: Handicapped  height Bathroom Accessibility: No   Home Equipment: Agricultural consultantolling Walker (2 wheels)          Prior Functioning/Environment Prior Level of Function : Needs assist             Mobility Comments: Reports that she uses RW, is able to get up and down stairs from 2nd story apartment, but that she rarely leaves apt. ADLs Comments: Pt reports that she was IND in bed mobility, toileting, bathing, dressing. She lives with her daughter, who handles all driving, cooking, cleaning, medication management, and blood sugar monitoring.        OT Problem List: Decreased strength;Decreased range of motion;Decreased activity tolerance;Impaired UE functional use;Pain      OT Treatment/Interventions: Self-care/ADL training;Patient/family education;Therapeutic exercise;Balance training;Energy conservation;Therapeutic activities;DME and/or AE instruction    OT Goals(Current goals can be found in the care plan section) Acute Rehab OT Goals Patient Stated Goal: to feel better OT Goal Formulation: With patient Time For Goal Achievement: 01/13/22 Potential to Achieve Goals: Good ADL Goals Pt Will Perform  Grooming: with modified independence;sitting Pt Will Transfer to Toilet: with modified independence;ambulating;regular height toilet;stand pivot transfer Pt/caregiver will Perform Home Exercise Program: Increased ROM;Increased strength;Independently Additional ADL Goal #1: Pt will be able to identify/demonstrate 2+ techniques for managing diabetes.  OT Frequency: Min 2X/week    Co-evaluation              AM-PAC OT "6 Clicks" Daily Activity     Outcome Measure Help from another person eating meals?: A Little Help from another person taking care of personal grooming?: A Lot Help from another person toileting, which includes using toliet, bedpan, or urinal?: A Lot Help from another person bathing (including washing, rinsing, drying)?: A Lot Help from another person to put on and taking off regular  upper body clothing?: A Lot Help from another person to put on and taking off regular lower body clothing?: A Lot 6 Click Score: 13   End of Session    Activity Tolerance: Patient limited by pain Patient left: in bed;with nursing/sitter in room;with call bell/phone within reach;with bed alarm set  OT Visit Diagnosis: Muscle weakness (generalized) (M62.81);Pain;Unsteadiness on feet (R26.81)                Time: 1610-9604 OT Time Calculation (min): 27 min Charges:  OT General Charges $OT Visit: 1 Visit OT Evaluation $OT Eval Moderate Complexity: 1 Mod OT Treatments $Self Care/Home Management : 8-22 mins Latina Craver, PhD, MS, OTR/L 12/30/21, 3:38 PM

## 2021-12-30 NOTE — NC FL2 (Signed)
?[place] MEDICAID FL2 LEVEL OF CARE SCREENING TOOL  ?  ? ?IDENTIFICATION  ?Patient Name: ?Terry Noble Birthdate: [birth year] Sex: female Admission Date (Current Location): ?12/29/2021  ?[place] and [place] Number: ? [place] ?  Facility and Address:  ?  ?     Provider Number: ?[id number]  ?Attending Physician Name and Address:  ?Gwynne Edinger, MD ? Relative Name and Phone Number:  ?  ?   ?Current Level of Care: ?Hospital Recommended Level of Care: ?[facility] Prior Approval Number: ?  ? ?Date Approved/Denied: ?  PASRR Number: ?[id number] ? ?Discharge Plan: ?SNF ?  ? ?Current Diagnoses: ?Patient Active Problem List  ? Diagnosis Date Noted  ? CKD (chronic kidney disease) stage 3, GFR 30-59 ml/min (HCC) 12/30/2021  ? SIRS (systemic inflammatory response syndrome) ([place]) 12/30/2021  ? Weakness 12/29/2021  ? Morbid obesity ([place])   ? Depression   ? Acute deep vein thrombosis (DVT) of distal vein of left lower extremity ([place]) 12/11/2021  ? Diabetes ([place]) 01/09/2018  ? HTN (hypertension) 01/09/2018  ? Obstructive sleep apnea 01/09/2018  ? Chewing tobacco use 01/09/2018  ? CHF (congestive heart failure) ([place]) 12/23/2017  ? ? ?Orientation RESPIRATION BLADDER Height & Weight   ?  ?Self, Time, Situation, Place ? Normal Incontinent, External catheter Weight: 120.3 kg ?Height:  5\' 5"  (165.1 cm)  ?BEHAVIORAL SYMPTOMS/MOOD NEUROLOGICAL BOWEL NUTRITION STATUS  ?    Continent Diet (Carb modified)  ?AMBULATORY STATUS COMMUNICATION OF NEEDS Skin   ?Extensive Assist Verbally Normal ?  ?  ?  ?    ?     ?     ? ? ?Personal Care Assistance Level of Assistance  ?    ?  ?  ?   ? ?Functional Limitations Info  ?    ?  ?   ? ? ?SPECIAL CARE FACTORS FREQUENCY  ?PT (By licensed PT), OT (By licensed OT)   ?  ?  ?  ?  ?  ?  ?   ? ? ?Contractures Contractures Info: Not present  ? ? ?Additional Factors Info  ?Code Status, Allergies Code Status Info: Full ?Allergies Info: Penicillins, Shellfish Allergy, Cherry, Cucumber Extract,  Lasix (Furosemide), Orange Fruit (Citrus), Oxycodone, Peanut-containing Drug Products ?  ?  ?  ?   ? ?Current Medications (12/30/2021):  This is the current hospital active medication list ?Current Facility-Administered Medications  ?Medication Dose Route Frequency Provider Last Rate Last Admin  ?  stroke: mapping our early stages of recovery book   Does not apply Once Agbata, Tochukwu, MD      ? 0.9 %  sodium chloride infusion   Intravenous Continuous Wouk, Ailene Rud, MD      ? acetaminophen (TYLENOL) tablet 500 mg  500 mg Oral Q6H PRN Agbata, Tochukwu, MD   500 mg at 12/30/21 1012  ? ARIPiprazole (ABILIFY) tablet 2 mg  2 mg Oral Daily Agbata, Tochukwu, MD   2 mg at 12/30/21 1013  ? atorvastatin (LIPITOR) tablet 40 mg  40 mg Oral Daily Agbata, Tochukwu, MD   40 mg at 12/30/21 1013  ? gabapentin (NEURONTIN) capsule 300 mg  300 mg Oral BID Agbata, Tochukwu, MD   300 mg at 12/30/21 1013  ? heparin ADULT infusion 100 units/mL (25000 units/252mL)  1,400 Units/hr Intravenous Continuous Rauer, Forde Dandy, RPH 14 mL/hr at 12/30/21 1210 1,400 Units/hr at 12/30/21 1210  ? insulin aspart (novoLOG) injection 0-15 Units  0-15 Units Subcutaneous TID WC Wouk, Ailene Rud, MD      ?  insulin aspart (novoLOG) injection 0-5 Units  0-5 Units Subcutaneous QHS Wouk, Ailene Rud, MD      ? insulin glargine-yfgn [facility]) injection 25 Units  25 Units Subcutaneous QHS Wouk, Ailene Rud, MD      ? lisinopril (ZESTRIL) tablet 20 mg  20 mg Oral Daily Wouk, Ailene Rud, MD   20 mg at 12/30/21 1206  ? loratadine (CLARITIN) tablet 10 mg  10 mg Oral Daily Agbata, Tochukwu, MD   10 mg at 12/30/21 1012  ? metoprolol succinate (TOPROL-XL) 24 hr tablet 25 mg  25 mg Oral Daily Gwynne Edinger, MD   25 mg at 12/30/21 1206  ? [START ON 12/31/2021] pantoprazole (PROTONIX) EC tablet 40 mg  40 mg Oral Q breakfast Wouk, Ailene Rud, MD      ? sertraline (ZOLOFT) tablet 37.5 mg  37.5 mg Oral Daily Agbata, Tochukwu, MD   37.5 mg at 12/30/21 1013   ? ? ? ?Discharge Medications: ?Please see discharge summary for a list of discharge medications. ? ?Relevant Imaging Results: ? ?Relevant Lab Results: ? ? ?Additional Information ?ss [id number] ? ?Beverly Sessions, RN ? ? ? ? ?

## 2021-12-30 NOTE — Progress Notes (Signed)
SLP Cancellation Note ? ?Patient Details ?Name: Terry Noble  ?MRN: [id number] ?DOB: 07/20/1966 ? ? ?Cancelled treatment:       Reason Eval/Treat Not Completed: SLP screened, no needs identified, will sign off  ? ?Per screening, pt A&Ox4. Speech is fluent, appropriate, and without s/sx dysarthria. Most recent NIHSS = 0. Head CT, MRI Brain, and CXR negative for acute findings. Pt denies changes to speech/language/cognition. Noted pt passed [facility] Swallowing Screening on 12/29/21 and no diet entered in EMR. MD and RN made aware.  ? ?SLP to sign off at this time as pt has no acute SLP needs. ? ?Terry Noble, M.S., CCC-SLP ?Speech-Language Pathologist ? - [facility] ?[phone] (ASCOM)  ? ?Terry Noble ?12/30/2021, 8:52 AM ?

## 2021-12-30 NOTE — Progress Notes (Signed)
Nutrition Brief Note ? ?Patient identified on the Malnutrition Screening Tool (MST) Report ? ?55 y/o female with h/o CHF, DM, HTN, OSA, MDD and CKD III who is admitted with possible allergic reaction.  ? ?Met with pt in room today. Pt reports good appetite and oral intake pta and in hospital. Pt reports eating 100% of meals in hospital. RD discussed the importance of adequate nutrition needed to preserve lean muscle. Pt declines ONS today.  ? ?Wt Readings from Last 15 Encounters:  ?12/29/21 120.3 kg  ?10/18/19 126.1 kg  ?03/18/19 118.8 kg  ?09/14/18 122.1 kg  ?07/22/18 126.1 kg  ?05/14/18 128.8 kg  ?01/27/18 131.1 kg  ?01/08/18 132.6 kg  ?12/25/17 129.4 kg  ? ? ?Body mass index is 44.13 kg/m?Marland Kitchen Patient meets criteria for morbid obesity based on current BMI. Pt reports her UBW fluctuates between 265-275lbs.  ? ?Current diet order is CHO modified, patient is consuming approximately 100% of meals at this time. Labs and medications reviewed.  ? ?No nutrition interventions warranted at this time. If nutrition issues arise, please consult RD.  ? ?Koleen Distance MS, RD, LDN ?Please refer to [facility] for RD and/or RD on-call/weekend/after hours pager ? ? ?

## 2021-12-30 NOTE — Consult Note (Addendum)
ORTHOPAEDIC CONSULTATION  REQUESTING PHYSICIAN: Wouk, Wilfred CurtisNoah Bedford, MD  Chief Complaint: bilateral upper and lower extremity pain  HPI: Terry Noble is a 56 y.o. female with history of morbid obesity, hypertension, diabetes mellitus, sleep apnea and CHF who complains of bilateral upper and lower extremity pain for the past 3 weeks. The pain is achey in Editor, commissioningcharacter. The pain is severe and 5/10. The pain is worse with movement and better with rest. Denies any constitutional symptoms.  Past Medical History:  Diagnosis Date   CHF (congestive heart failure) (HCC)    Diabetes mellitus without complication (HCC)    Hypertension    Morbid obesity (HCC)    Sleep apnea    Past Surgical History:  Procedure Laterality Date   none     Social History   Socioeconomic History   Marital status: Single    Spouse name: Not on file   Number of children: Not on file   Years of education: Not on file   Highest education level: Not on file  Occupational History   Not on file  Tobacco Use   Smoking status: Former   Smokeless tobacco: Current    Types: Chew  Substance and Sexual Activity   Alcohol use: No   Drug use: Never   Sexual activity: Not Currently  Other Topics Concern   Not on file  Social History Narrative   Not on file   Social Determinants of Health   Financial Resource Strain: Not on file  Food Insecurity: Not on file  Transportation Needs: Not on file  Physical Activity: Not on file  Stress: Not on file  Social Connections: Not on file   Family History  Problem Relation Age of Onset   Hypertension Mother    Allergies  Allergen Reactions   Penicillins Shortness Of Breath    Itching, swelling   Shellfish Allergy Hives and Shortness Of Breath   Cherry    Cucumber Extract Hives    pickles   Lasix [Furosemide] Swelling   Orange Fruit [Citrus] Hives   Oxycodone    Peanut-Containing Drug Products Hives   Prior to Admission medications   Medication Sig Start Date  End Date Taking? Authorizing Provider  acetaminophen (TYLENOL) 500 MG tablet Take 500 mg by mouth every 6 (six) hours as needed.   Yes [provider]  ARIPiprazole (ABILIFY) 2 MG tablet Take 2 mg by mouth daily.   Yes [provider]  aspirin EC 81 MG tablet Take 81 mg by mouth daily.   Yes [provider]  chlorthalidone (HYGROTON) 25 MG tablet Take 25 mg by mouth daily. 12/11/21  Yes [provider]  ELIQUIS 5 MG TABS tablet Take 5 mg by mouth 2 (two) times daily. 12/11/21  Yes [provider]  EPINEPHrine 0.3 mg/0.3 mL IJ SOAJ injection Inject 0.3 mg into the muscle once.   Yes [provider]  gabapentin (NEURONTIN) 300 MG capsule Take 300 mg by mouth 2 (two) times daily.    Yes [provider]  insulin glargine (LANTUS SOLOSTAR) 100 UNIT/ML Solostar Pen Inject 25 Units into the skin 2 (two) times daily. 12/11/21 01/12/22 Yes [provider]  lisinopril (ZESTRIL) 40 MG tablet Take 40 mg by mouth daily. 12/11/21  Yes [provider]  loratadine (CLARITIN) 10 MG tablet Take 10 mg by mouth daily.   Yes [provider]  Magnesium Bisglycinate Dihyd POWD 200 mg by Does not apply route daily.   Yes [provider]  metoprolol  succinate (TOPROL-XL) 25 MG 24 hr tablet Take 25 mg by mouth daily.   Yes [provider]  sertraline (ZOLOFT) 25 MG tablet Take 37.5 mg by mouth daily.    Yes [provider]  JANUVIA 25 MG tablet Take 25 mg by mouth daily. Patient not taking: Reported on 12/29/2021 08/22/21   [provider]  metFORMIN (GLUCOPHAGE) 1000 MG tablet Take 1,000 mg by mouth 2 (two) times daily with a meal. 1500 mg daily Patient not taking: Reported on 12/29/2021    [provider]  pantoprazole (PROTONIX) 40 MG tablet Take 40 mg by mouth daily. 12/11/21   [provider]  torsemide (DEMADEX) 20 MG tablet Take 2 tablets (40 mg total) by mouth daily. Patient not  taking: Reported on 12/29/2021 04/22/19   Delma Freeze, FNP   DG Chest 2 View  Result Date: 12/29/2021 CLINICAL DATA:  Shortness of breath EXAM: CHEST - 2 VIEW COMPARISON:  12/23/2017 FINDINGS: The heart size and mediastinal contours are within normal limits. Both lungs are clear. The visualized skeletal structures are unremarkable. IMPRESSION: No active cardiopulmonary disease. Electronically Signed   By: Elige Ko M.D.   On: 12/29/2021 11:26   DG Shoulder Right  Result Date: 12/30/2021 CLINICAL DATA:  Right shoulder pain.  Known injury. EXAM: RIGHT SHOULDER - 2+ VIEW COMPARISON:  None available FINDINGS: Mild acromioclavicular joint space narrowing and peripheral osteophytosis degenerative change. Minimal inferior glenoid and humeral head-neck junction degenerative osteophytosis. Mild-to-moderate cystic change within the superolateral humeral head, possibly from chronic subacromial impingement. Mild distal lateral subacromial spurring. No acute fracture or dislocation. The visualized portion of the right lung is unremarkable. 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 Electronically Signed   By: Neita Garnet M.D.   On: 12/30/2021 11:12   DG Wrist 2 Views Right  Result Date: 12/30/2021 CLINICAL DATA:  Right wrist pain for 3 days.  No known injury. EXAM: RIGHT WRIST - 2 VIEW COMPARISON:  None. FINDINGS: There is relative widening of the scapholunate interval measuring approximately 4 mm compared to the lunotriquetral interval measuring 1 mm. Moderate triscaphe joint and thumb carpometacarpal joint space narrowing and peripheral osteophytosis degenerative change. Neutral ulnar variance. No acute fracture or dislocation. IMPRESSION:: IMPRESSION: 1. 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 2. Moderate triscaphe joint and thumb carpometacarpal joint osteoarthritis. Electronically Signed   By: Neita Garnet  M.D.   On: 12/30/2021 11:11   DG Ankle 2 Views Left  Result Date: 12/30/2021 CLINICAL DATA:  Three days of left ankle pain. EXAM: LEFT ANKLE - 2 VIEW COMPARISON:  None. FINDINGS: Moderate to large calcaneal heel spur. Mild-to-moderate chronic spurring at the Achilles insertion on the calcaneus. The ankle mortise is symmetric and intact. Moderate diffuse ankle soft tissue swelling possibly systemic. There are calcifications overlying the posterior greater than anterior aspects of the distal calf which may be secondary to chronic stasis/subcutaneous edema. Moderate dorsal midfoot degenerative osteophytes. No acute fracture is seen. No dislocation. IMPRESSION:: IMPRESSION: 1. Moderate to large calcaneal heel spur. 2. Moderate dorsal midfoot osteoarthritis. 3. Soft tissue swelling and calcifications possibly related to chronic stasis/subcutaneous edema. Electronically Signed   By: Neita Garnet M.D.   On: 12/30/2021 11:07   DG Ankle 2 Views Right  Result Date: 12/30/2021 CLINICAL DATA:  Right ankle pain for 3 days. EXAM: RIGHT ANKLE - 2 VIEW COMPARISON:  None. FINDINGS: Mild degenerative spurring extending medially from the mid height of the medial malleolus. The ankle  mortise is symmetric and intact. Mild distal fibula and adjacent lateral talar degenerative osteophytes. Moderate dorsal talonavicular and navicular-cuneiform degenerative spurring. Mild-to-moderate calcaneal heel spur. Mild chronic spurring at the Achilles insertion on the calcaneus. Mild anterior tibiotalar joint space narrowing. Likely pes planus. Moderate diffuse ankle soft tissue swelling. Calcifications throughout the visualized calf greatest posteriorly and medially. Findings are suggestive of the sequela of chronic stasis/subcutaneous edema. IMPRESSION:: IMPRESSION: 1. Mild-to-moderate calcaneal heel spur. 2. Moderate midfoot osteoarthritis. 3. Moderate diffuse soft tissue swelling with calcifications suggesting the sequela of chronic  stasis/subcutaneous edema. Electronically Signed   By: Neita Garnet M.D.   On: 12/30/2021 11:09   CT Head Wo Contrast  Result Date: 12/29/2021 CLINICAL DATA:  States this morning she rolled out of bed and could not get up off floor and urinated on self because was too weak to get up. Pt states main problem now is feels weak and legs hurt. 56 EXAM: CT HEAD WITHOUT CONTRAST TECHNIQUE: Contiguous axial images were obtained from the base of the skull through the vertex without intravenous contrast. RADIATION DOSE REDUCTION: This exam was performed according to the departmental dose-optimization program which includes automated exposure control, adjustment of the mA and/or kV according to patient size and/or use of iterative reconstruction technique. COMPARISON:  None. FINDINGS: Brain: No evidence of acute infarction, hemorrhage, hydrocephalus, extra-axial collection or mass lesion/mass effect. Vascular: No hyperdense vessel or unexpected calcification. Skull: No osseous abnormality. Sinuses/Orbits: Visualized paranasal sinuses are clear. Visualized mastoid sinuses are clear. Visualized orbits demonstrate no focal abnormality. Other: None IMPRESSION: 1. No acute intracranial abnormality. Electronically Signed   By: Elige Ko M.D.   On: 12/29/2021 09:24   MR BRAIN WO CONTRAST  Result Date: 12/29/2021 CLINICAL DATA:  Neuro deficit, acute, stroke suspected. EXAM: MRI HEAD WITHOUT CONTRAST TECHNIQUE: Multiplanar, multiecho pulse sequences of the brain and surrounding structures were obtained without intravenous contrast. COMPARISON:  Head CT 12/29/2021. FINDINGS: Brain: Cerebral volume is normal. Mild multifocal T2 FLAIR hyperintense signal abnormality within the cerebral white matter, nonspecific but compatible with chronic small vessel ischemic disease. No cortical encephalomalacia is identified. There is no acute infarct. No evidence of an intracranial mass. No chronic intracranial blood products. No extra-axial  fluid collection. No midline shift. Vascular: Maintained flow voids within the proximal large arterial vessels. Skull and upper cervical spine: No focal suspicious marrow lesion. Sinuses/Orbits: Visualized orbits show no acute finding. No significant paranasal sinus disease. IMPRESSION: No evidence of acute intracranial abnormality. Mild chronic small vessel ischemic changes within the cerebral white matter. Electronically Signed   By: Jackey Loge D.O.   On: 12/29/2021 14:25   ECHOCARDIOGRAM COMPLETE  Result Date: 12/30/2021    ECHOCARDIOGRAM REPORT   Patient Name:   Terry Noble Date of Exam: 12/29/2021 Medical Rec #:  [id number]    Height:       65.0 in Accession #:    [id number]   Weight:       274.0 lb Date of Birth:  Sep 20, 1966     BSA:          2.261 m Patient Age:    55 years     BP:           146/71 mmHg Patient Gender: F            HR:           111 bpm. Exam Location:  [facility] Procedure: 2D Echo, Cardiac Doppler and Color Doppler Indications:     Stroke I63.9  History:         Patient has prior history of Echocardiogram examinations. CHF;                  Risk Factors:Hypertension and Diabetes.  Sonographer:     Neysa Bonito Roar Referring Phys:  [id number] AGBATA Diagnosing Phys: Sena Slate IMPRESSIONS  1. Left ventricular ejection fraction, by estimation, is 55 to 60%. The left ventricle has normal function. The left ventricle has no regional wall motion abnormalities. The left ventricular internal cavity size was mildly dilated. Indeterminate diastolic filling due to E-A fusion.  2. Right ventricular systolic function was not well visualized. The right ventricular size is not well visualized.  3. The mitral valve is normal in structure. No evidence of mitral valve regurgitation.  4. The aortic valve is normal in structure. Aortic valve regurgitation is not visualized. No aortic stenosis is present. Conclusion(s)/Recommendation(s): TECHNICALLY DIFFICULT STUDY. FINDINGS  Left Ventricle: Left ventricular  ejection fraction, by estimation, is 55 to 60%. The left ventricle has normal function. The left ventricle has no regional wall motion abnormalities. The left ventricular internal cavity size was mildly dilated. There is  no left ventricular hypertrophy. Indeterminate diastolic filling due to E-A fusion. Right Ventricle: The right ventricular size is not well visualized. Right ventricular systolic function was not well visualized. Left Atrium: Left atrial size was normal in size. Right Atrium: Right atrial size was not well visualized. Pericardium: There is no evidence of pericardial effusion. Mitral Valve: The mitral valve is normal in structure. No evidence of mitral valve regurgitation. Tricuspid Valve: The tricuspid valve is not well visualized. Aortic Valve: The aortic valve is normal in structure. Aortic valve regurgitation is not visualized. No aortic stenosis is present. Aortic valve peak gradient measures 10.6 mmHg. Pulmonic Valve: The pulmonic valve was not well visualized. Pulmonic valve regurgitation is not visualized. No evidence of pulmonic stenosis. Aorta: The aortic root and ascending aorta are structurally normal, with no evidence of dilitation. Venous: The inferior vena cava was not well visualized. IAS/Shunts: The interatrial septum was not well visualized.  LEFT VENTRICLE PLAX 2D LVIDd:         5.22 cm   Diastology LVIDs:         3.72 cm   LV e' medial:    6.20 cm/s LV PW:         1.01 cm   LV E/e' medial:  12.7 LV IVS:        1.06 cm   LV e' lateral:   9.90 cm/s LVOT diam:     2.00 cm   LV E/e' lateral: 8.0 LVOT Area:     3.14 cm  RIGHT VENTRICLE RV Mid diam:    3.29 cm RV S prime:     16.80 cm/s TAPSE (M-mode): 2.1 cm LEFT ATRIUM             Index LA diam:        3.90 cm 1.72 cm/m LA Vol (A2C):   42.9 ml 18.97 ml/m LA Vol (A4C):   39.5 ml 17.47 ml/m LA Biplane Vol: 41.3 ml 18.26 ml/m  AORTIC VALVE                 PULMONIC VALVE AV Area (Vmax): 2.52 cm     PV Vmax:        1.24 m/s AV Vmax:         163.00 cm/s  PV Peak grad:   6.2 mmHg AV Peak Grad:  10.6 mmHg    RVOT Peak grad: 4 mmHg LVOT Vmax:      131.00 cm/s  AORTA Ao Root diam: 2.60 cm Ao Asc diam:  2.90 cm MITRAL VALVE MV Area (PHT): 20.50 cm   SHUNTS MV Decel Time: 37 msec     Systemic Diam: 2.00 cm MV E velocity: 79.00 cm/s MV A Prime:    11.6 cm/s Sena Slate Electronically signed by Sena Slate Signature Date/Time: 12/30/2021/1:05:21 PM    Final     Positive ROS: All other systems have been reviewed and were otherwise negative with the exception of those mentioned in the HPI and as above.  Physical Exam: General: Alert, no acute distress Cardiovascular: No pedal edema Respiratory: No cyanosis, no use of accessory musculature GI: No organomegaly, abdomen is soft and non-tender Skin: No lesions in the area of chief complaint Neurologic: Sensation intact distally Psychiatric: Patient is competent for consent with normal mood and affect Lymphatic: No axillary or cervical lymphadenopathy  MUSCULOSKELETAL: able to move all extremities. No effusions noted in bilateral elbows, wrists, knees, and ankles. Compartments soft. Good cap refill. Motor and sensory intact distally.  Assessment:  1. Weakness  2. Pain - DG Ankle 2 Views Left; Standing - DG Ankle 2 Views Right; Standing - DG Shoulder Right; Standing - DG Wrist 2 Views Right; Standing - DG Ankle 2 Views Left - DG Ankle 2 Views Right - DG Shoulder Right - DG Wrist 2 Views Right   Plan:   Multiple joint pain with elevated inflammatory markers. No left shift. Recommend PT/OT with WBAT. Possible inflammatory arthritis, would recommend rheumatology as an outpatient. Continue to follow blood cultures. If joint pain worsens consider interventional radiology for joint aspiration. Please call with questions   Lyndle Herrlich, MD    12/30/2021 2:42 PM

## 2021-12-30 NOTE — TOC Initial Note (Signed)
Transition of Care (TOC) - Initial/Assessment Note  ? ? ?Patient Details  ?Name: Terry Noble  ?MRN: [id number] ?Date of Birth: 10/26/1966 ? ?Transition of Care (TOC) CM/SW Contact:    ?Chapman FitchStephanie T Elzora Cullins, RN ?Phone Number: ?12/30/2021, 4:10 PM ? ?Clinical Narrative:                 ?Admitted for: weakness, SIRS ?Admitted from: Home with daughter ?PCP: Phineas Realharles Drew - family transports  ?Current home health/prior home health/DME: RW.  [facility] ? ?[place] was [place].  And was discharged home with home health services on 12/16/21.  Patient states that she was doing better with therapy and was close to being discharged from their services.  ? ?PT recommending SNF. Patient in agreement for bed search ?Obtained PASRR ?Fl2 sent for signature ?Bed search initiated  ? ?Expected Discharge Plan: Skilled Nursing Facility ?Barriers to Discharge: Continued Medical Work up ? ? ?Patient Goals and CMS Choice ?  ?  ?  ? ?Expected Discharge Plan and Services ?Expected Discharge Plan: Skilled Nursing Facility ?  ?Discharge Planning Services: CM Consult ?  ?  ?                ?  ?  ?  ?  ?  ?  ?  ?  ?  ?  ? ?Prior Living Arrangements/Services ?  ?Lives with:: Adult Children ?Patient language and need for interpreter reviewed:: Yes ?Do you feel safe going back to the place where you live?: Yes      ?Need for Family Participation in Patient Care: Yes (Comment) ?Care giver support system in place?: Yes (comment) ?Current home services: DME ?Criminal Activity/Legal Involvement Pertinent to Current Situation/Hospitalization: No - Comment as needed ? ?Activities of Daily Living ?Home Assistive Devices/Equipment: Dan HumphreysWalker (specify type) ?ADL Screening (condition at time of admission) ?Patient's cognitive ability adequate to safely complete daily activities?: Yes ?Is the patient deaf or have difficulty hearing?: No ?Does the patient have difficulty seeing, even when wearing glasses/contacts?:  No ?Does the patient have difficulty concentrating, remembering, or making decisions?: No ?Patient able to express need for assistance with ADLs?: Yes ?Does the patient have difficulty dressing or bathing?: No ?Independently performs ADLs?: No ?Communication: Independent ?Dressing (OT): Needs assistance ?Is this a change from baseline?: Change from baseline, expected to last >3 days ?Grooming: Needs assistance ?Is this a change from baseline?: Change from baseline, expected to last >3 days ?Feeding: Independent ?Bathing: Needs assistance ?Is this a change from baseline?: Change from baseline, expected to last >3 days ?Toileting: Needs assistance ?Is this a change from baseline?: Change from baseline, expected to last >3days ?In/Out Bed: Needs assistance ?Is this a change from baseline?: Change from baseline, expected to last >3 days ?Walks in Home: Dependent ?Is this a change from baseline?: Change from baseline, expected to last >3 days ?Does the patient have difficulty walking or climbing stairs?: Yes ?Weakness of Legs: Left ?Weakness of Arms/Hands: Left ? ?Permission Sought/Granted ?  ?  ?   ?   ?   ?   ? ?Emotional Assessment ?  ?  ?  ?Orientation: : Oriented to Self, Oriented to Place, Oriented to  Time, Oriented to Situation ?Alcohol / Substance Use: Not Applicable ?Psych Involvement: No (comment) ? ?Admission diagnosis:  Weakness [R53.1] ?SIRS (systemic inflammatory response syndrome) (HCC) [R65.10] ?Patient Active Problem List  ? Diagnosis Date Noted  ? CKD (chronic kidney disease) stage 3, GFR 30-59 ml/min (HCC)  12/30/2021  ? SIRS (systemic inflammatory response syndrome) (HCC) 12/30/2021  ? Weakness 12/29/2021  ? Morbid obesity (HCC)   ? Depression   ? Acute deep vein thrombosis (DVT) of distal vein of left lower extremity (HCC) 12/11/2021  ? Diabetes (HCC) 01/09/2018  ? HTN (hypertension) 01/09/2018  ? Obstructive sleep apnea 01/09/2018  ? Chewing tobacco use 01/09/2018  ? CHF (congestive heart failure)  (HCC) 12/23/2017  ? ?PCP:  Oswaldo Conroy, MD ?Pharmacy:   ?[facility] ?[address] ?[place] [address] ?Phone: [phone] Fax: [phone] ? ? ? ? ?Social Determinants of Health (SDOH) Interventions ?  ? ?Readmission Risk Interventions ?No flowsheet data found. ? ? ?

## 2021-12-30 NOTE — Progress Notes (Addendum)
PROGRESS NOTE    Terry Noble  PZW:[id number] DOB: [birth year] DOA: 12/29/2021 PCP: Letta Median, MD  Outpatient Specialists: cardiology    Brief Narrative:   From admission h and p Terry Noble is a 56 y.o. female with medical history significant for morbid obesity, hypertension, diabetes mellitus, sleep apnea and CHF who presents to the ER for evaluation of a 2-day history of left-sided weakness. Patient notes that she has had numbness and tingling in her left upper and lower extremity since Friday and thought it was an allergic reaction.  She took her EpiPen and Benadryl without any improvement and so she called EMS. She denies having any difficulty swallowing or slurred speech.  She complains of blurred vision but denies having any headache, no dizziness, no lightheadedness, no nausea or vomiting.  She states that she rolled out of bed this morning and landed on the floor and could not get up due to the weakness.  She also voided on herself because she was too weak to get up. She denies having any chest pain, no shortness of breath, no changes in her bowel habits, no back pain, no fever, no cough, no chills, no urinary symptoms. Initial CT scan of the head without contrast does not show an acute bleed.  Not noted at time of admission but patient was recently admitted [facility], discharged to [facility], recently returned home. Admission was for uti/cellulitis with sepsis, required pressors, also had hhs, also new dvt and aki.   Assessment & Plan:   Principal Problem:   Weakness Active Problems:   CHF (congestive heart failure) (HCC)   Diabetes (HCC)   HTN (hypertension)   Obstructive sleep apnea   Morbid obesity (HCC)   Depression   CKD (chronic kidney disease) stage 3, GFR 30-59 ml/min (HCC)   SIRS (systemic inflammatory response syndrome) ([place])  # Weakness Patient presents with left sided weakness with initial concern for stroke. Review of previous admission note shows this to be  chronic. Ct and mri are negative, this does not appear to be stroke  # SIRS Recent admission for cellulitis and uti sepsis, treated. Here no signs cellulitis and urine is clear though does have fever and tachycardia and leukocytosis. With significant pain in joints particularly right shoulder, right wrist, right ankles. Thus concern for septic arthritis. No respiratory symptoms, cxr clear, covid/flu neg. Possible rheumatologic problem? Ck and la wnl - ortho consulted - f/u blood culture, crp/esr - f/u urine culture - NS @ 100 - f/u echo - f/u uric acid  # Debility - pt/ot consults  # DVT Recent diagnosis, on eliquis - heparin for now  # MDD - home abilify  # Neuropathy - home gabapentin  # T2DM Recent admit for hhs. Here glucose wnl - semglee basal - SSI  # CKD 3b Cr 1.4 is at baseline  # Anemia, normocytic Hgb 8s is at baseline - f/u iron panel  # HFpEF Appears euvolemic - f/u tte ordered by admitting provider - home chlorthalidone on hold  # HTN Here mild elevation - homne lisinopril - chlorthal on hold  # Neuropathy - home gabapentin  # OSA - cpap qhs   DVT prophylaxis: heparin Code Status: full Family Communication: none @ bedside  Level of care: Med-Surg Status is: Inpatient Remains inpatient appropriate because: severity of illness    Consultants:  ortho  Procedures: none  Antimicrobials:  None for now    Subjective: Diffuse pain joints right arm and lower extremities. No cough or  chest pain. Hungry.  Objective: Vitals:   12/29/21 2041 12/29/21 2324 12/30/21 0426 12/30/21 0808  BP:  120/71 (!) 151/84 (!) 158/84  Pulse:  (!) 124 (!) 120 (!) 121  Resp:  _0 Temp:  99.9 F (37.7 C) 98.7 F (37.1 C) 100.3 F (37.9 C)  TempSrc:  Oral    SpO2:  95% 97% 96%  Weight: 120.3 kg     Height: _1  (1.651 m)       Intake/Output Summary (Last 24 hours) at 12/30/2021 1012 Last data filed at 12/30/2021 0300 Gross per 24 hour   Intake --  Output 900 ml  Net -900 ml   Filed Weights   12/29/21 0837 12/29/21 2041  Weight: 124.3 kg 120.3 kg    Examination:  General exam: holding right arm appears in mild pain Respiratory system: Clear to auscultation. Respiratory effort normal. Cardiovascular system: S1 & S2 heard, tachycardic. No JVD, murmurs, rubs, gallops or clicks.  Gastrointestinal system: Abdomen is obese, soft and nontender. No organomegaly or masses felt. Normal bowel sounds heard. Central nervous system: Alert and oriented. No focal neurological deficits. Extremities: Symmetric 5 x 5 power. Trace pedal edema Skin: No rashes, lesions or ulcers Psychiatry: Judgement and insight appear normal. Mood & affect appropriate.  MSK: severe pain with attempted movement of right shoulder, right wrist, b/l ankles    Data Reviewed: I have personally reviewed following labs and imaging studies  CBC: Recent Labs  Lab 12/29/21 0843  WBC 14.8*  NEUTROABS 10.2*  HGB 8.6*  HCT 29.0*  MCV 87.6  PLT 283   Basic Metabolic Panel: Recent Labs  Lab 12/29/21 0843  NA 138  K 4.2  CL 102  CO2 23  GLUCOSE 176*  BUN 22*  CREATININE 1.41*  CALCIUM 9.0   GFR: Estimated Creatinine Clearance: 59.3 mL/min (A) (by C-G formula based on SCr of 1.41 mg/dL (H)). Liver Function Tests: Recent Labs  Lab 12/29/21 0843  AST 18  ALT 13  ALKPHOS 68  BILITOT 0.8  PROT 7.9  ALBUMIN 3.5   No results for input(s): LIPASE, AMYLASE in the last 168 hours. No results for input(s): AMMONIA in the last 168 hours. Coagulation Profile: No results for input(s): INR, PROTIME in the last 168 hours. Cardiac Enzymes: Recent Labs  Lab 12/29/21 0843  CKTOTAL 51   BNP (last 3 results) No results for input(s): PROBNP in the last 8760 hours. HbA1C: No results for input(s): HGBA1C in the last 72 hours. CBG: Recent Labs  Lab 12/29/21 1807 12/29/21 2029 12/29/21 2320 12/30/21 0424 12/30/21 0807  GLUCAP 112* 116* 122* 128*  122*   Lipid Profile: Recent Labs    12/30/21 0606  CHOL 136  HDL 34*  LDLCALC 86  TRIG 78  CHOLHDL 4.0   Thyroid Function Tests: No results for input(s): TSH, T4TOTAL, FREET4, T3FREE, THYROIDAB in the last 72 hours. Anemia Panel: No results for input(s): VITAMINB12, FOLATE, FERRITIN, TIBC, IRON, RETICCTPCT in the last 72 hours. Urine analysis:    Component Value Date/Time   COLORURINE YELLOW (A) 12/29/2021 0940   APPEARANCEUR HAZY (A) 12/29/2021 0940   LABSPEC 1.016 12/29/2021 0940   PHURINE 9.0 (H) 12/29/2021 0940   GLUCOSEU NEGATIVE 12/29/2021 0940   HGBUR NEGATIVE 12/29/2021 0940   BILIRUBINUR NEGATIVE 12/29/2021 0940   KETONESUR NEGATIVE 12/29/2021 0940   PROTEINUR 30 (A) 12/29/2021 0940   NITRITE NEGATIVE 12/29/2021 0940   LEUKOCYTESUR NEGATIVE 12/29/2021 0940   Sepsis Labs: _2 (procalcitonin:4,lacticidven:4)  ) Recent  Results (from the past 240 hour(s))  Resp Panel by RT-PCR (Flu A&B, Covid) Nasopharyngeal Swab     Status: None   Collection Time: 12/29/21  8:43 AM   Specimen: Nasopharyngeal Swab; Nasopharyngeal(NP) swabs in vial transport medium  Result Value Ref Range Status   SARS Coronavirus 2 by RT PCR NEGATIVE NEGATIVE Final    Comment: (NOTE) SARS-CoV-2 target nucleic acids are NOT DETECTED.  The SARS-CoV-2 RNA is generally detectable in upper respiratory specimens during the acute phase of infection. The lowest concentration of SARS-CoV-2 viral copies this assay can detect is 138 copies/mL. A negative result does not preclude SARS-Cov-2 infection and should not be used as the sole basis for treatment or other patient management decisions. A negative result may occur with  improper specimen collection/handling, submission of specimen other than nasopharyngeal swab, presence of viral mutation(s) within the areas targeted by this assay, and inadequate number of viral copies(<138 copies/mL). A negative result must be combined with clinical  observations, patient history, and epidemiological information. The expected result is Negative.  Fact Sheet for Patients:  EntrepreneurPulse.com.au  Fact Sheet for Healthcare Providers:  IncredibleEmployment.be  This test is no t yet approved or cleared by the [place] FDA and  has been authorized for detection and/or diagnosis of SARS-CoV-2 by FDA under an Emergency Use Authorization (EUA). This EUA will remain  in effect (meaning this test can be used) for the duration of the COVID-19 declaration under Section 564(b)(1) of the Act, 21 U.S.C.section 360bbb-3(b)(1), unless the authorization is terminated  or revoked sooner.       Influenza A by PCR NEGATIVE NEGATIVE Final   Influenza B by PCR NEGATIVE NEGATIVE Final    Comment: (NOTE) The Xpert Xpress SARS-CoV-2/FLU/RSV plus assay is intended as an aid in the diagnosis of influenza from Nasopharyngeal swab specimens and should not be used as a sole basis for treatment. Nasal washings and aspirates are unacceptable for Xpert Xpress SARS-CoV-2/FLU/RSV testing.  Fact Sheet for Patients: EntrepreneurPulse.com.au  Fact Sheet for Healthcare Providers: IncredibleEmployment.be  This test is not yet approved or cleared by the [place] FDA and has been authorized for detection and/or diagnosis of SARS-CoV-2 by FDA under an Emergency Use Authorization (EUA). This EUA will remain in effect (meaning this test can be used) for the duration of the COVID-19 declaration under Section 564(b)(1) of the Act, 21 U.S.C. section 360bbb-3(b)(1), unless the authorization is terminated or revoked.  Performed at [facility], [address], [place], [place] [address]          Radiology Studies: DG Chest 2 View  Result Date: 12/29/2021 CLINICAL DATA:  Shortness of breath EXAM: CHEST - 2 VIEW COMPARISON:  12/23/2017 FINDINGS: The heart size and  mediastinal contours are within normal limits. Both lungs are clear. The visualized skeletal structures are unremarkable. IMPRESSION: No active cardiopulmonary disease. Electronically Signed   By: Kathreen Devoid M.D.   On: 12/29/2021 11:26   CT Head Wo Contrast  Result Date: 12/29/2021 CLINICAL DATA:  States this morning she rolled out of bed and could not get up off floor and urinated on self because was too weak to get up. Pt states main problem now is feels weak and legs hurt. 68 EXAM: CT HEAD WITHOUT CONTRAST TECHNIQUE: Contiguous axial images were obtained from the base of the skull through the vertex without intravenous contrast. RADIATION DOSE REDUCTION: This exam was performed according to the departmental dose-optimization program which includes automated exposure control, adjustment of the mA  and/or kV according to patient size and/or use of iterative reconstruction technique. COMPARISON:  None. FINDINGS: Brain: No evidence of acute infarction, hemorrhage, hydrocephalus, extra-axial collection or mass lesion/mass effect. Vascular: No hyperdense vessel or unexpected calcification. Skull: No osseous abnormality. Sinuses/Orbits: Visualized paranasal sinuses are clear. Visualized mastoid sinuses are clear. Visualized orbits demonstrate no focal abnormality. Other: None IMPRESSION: 1. No acute intracranial abnormality. Electronically Signed   By: Kathreen Devoid M.D.   On: 12/29/2021 09:24   MR BRAIN WO CONTRAST  Result Date: 12/29/2021 CLINICAL DATA:  Neuro deficit, acute, stroke suspected. EXAM: MRI HEAD WITHOUT CONTRAST TECHNIQUE: Multiplanar, multiecho pulse sequences of the brain and surrounding structures were obtained without intravenous contrast. COMPARISON:  Head CT 12/29/2021. FINDINGS: Brain: Cerebral volume is normal. Mild multifocal T2 FLAIR hyperintense signal abnormality within the cerebral white matter, nonspecific but compatible with chronic small vessel ischemic disease. No cortical  encephalomalacia is identified. There is no acute infarct. No evidence of an intracranial mass. No chronic intracranial blood products. No extra-axial fluid collection. No midline shift. Vascular: Maintained flow voids within the proximal large arterial vessels. Skull and upper cervical spine: No focal suspicious marrow lesion. Sinuses/Orbits: Visualized orbits show no acute finding. No significant paranasal sinus disease. IMPRESSION: No evidence of acute intracranial abnormality. Mild chronic small vessel ischemic changes within the cerebral white matter. Electronically Signed   By: Kellie Simmering D.O.   On: 12/29/2021 14:25        Scheduled Meds:   stroke: mapping our early stages of recovery book   Does not apply Once   ARIPiprazole  2 mg Oral Daily   aspirin EC  81 mg Oral Daily   atorvastatin  40 mg Oral Daily   gabapentin  300 mg Oral BID   loratadine  10 mg Oral Daily   sertraline  37.5 mg Oral Daily   Continuous Infusions:   LOS: 0 days    Time spent: 56 min    Desma Maxim, MD [facility]   If 7PM-7AM, please contact night-coverage [email or web address] Password [email or web address] 12/30/2021, 10:12 AM

## 2021-12-30 NOTE — Progress Notes (Signed)
ANTICOAGULATION CONSULT NOTE ? ?Pharmacy Consult for Apixaban ?Indication: VTE treatment ? ?Patient Measurements: ?Height: 5\' 5"  (165.1 cm) ?Weight: 120.3 kg (265 lb 3.4 oz) ?IBW/kg (Calculated) : 57 ? ?Labs: ?Recent Labs  ?  12/29/21 ?[id number] 12/29/21 ?1432 12/30/21 ?1046  ?HGB 8.6*  --   --   ?HCT 29.0*  --   --   ?PLT 383  --   --   ?APTT  --   --  35  ?LABPROT  --   --  15.4*  ?INR  --   --  1.2  ?HEPARINUNFRC  --   --  0.92*  ?CREATININE 1.41*  --   --   ?CKTOTAL 51  --   --   ?TROPONINIHS 4 5  --   ? ? ? ?Estimated Creatinine Clearance: 59.3 mL/min (A) (by C-G formula based on SCr of 1.41 mg/dL (H)). ? ? ?Medical History: ?Past Medical History:  ?Diagnosis Date  ? CHF (congestive heart failure) ([place])   ? Diabetes mellitus without complication ([place])   ? Hypertension   ? Morbid obesity ([place])   ? Sleep apnea   ? ? ?Medications:  ?Eliquis 5 mg BID - last dose reported 3/4 1800 ? ?Assessment: ?56 y.o. female with medical history significant for morbid obesity, hypertension, diabetes mellitus, sleep apnea and CHF who presented to the ER for left-sided weakness. Pt had recent diagnosis of DVT and was on apixaban prior to admission. Pharmacy has been consulted to transition heparin back to home apixaban.  ?  ?Plan:  ?Discontinue IV heparin ?Re-start apixaban 5 mg BID ?CBC at least every 3 days per protocol ? ?Benita Gutter ?12/30/2021,5:33 PM ? ? ?

## 2021-12-30 NOTE — Plan of Care (Signed)

## 2021-12-30 NOTE — Evaluation (Addendum)
Physical Therapy Evaluation ?Patient Details ?Name: Terry Noble ?MRN: [id number] ?DOB: 05/19/1966 ?Today's Date: 12/30/2021 ? ?History of Present Illness ? Pt is a 56 y.o. female who presents to the ED via EMS after Pt report of allergic reaction to Cheerwine soda, and was given an EpiPen. Pt stated that she started feeling swelling in both arms and legs, pain in the legs, and some shakiness. Admitted for weakness, imaging showed no evidence of acute intracranial abnormality. PmHx: CHF, DM, HTN, Obesity, Sleep Apnea. ?  ?Clinical Impression ? Pt is awake and resting in bed upon PT entrance into room for evaluation today. Pt is A&Ox4 and has no c/o pain at rest, but did complain of generalized pain with all mobility attempts.  Pt reports that prior to hospitalization she was independent w/ ADLs and used a RW to get around her home. She lives in a second floor apartment w/ her daughter; and there are 17 STE. ? ?Pt is able to perform bed mobility w/ minA for trunk elevation and LE transfers. Pt is able to sit EOB for ~44min, but has difficulty scooting further towards EOB due to increase c/o UE and LE pain. Pt reports that this pain has started since all of her new symptoms on Thursday. Pt was unable to perform sit to stand after 2 attempts w/ maxAx1 w/ RW, as well as w/ a 3rd attempt and maxAx2 and height of bed elevated. Also unable to stand with maxAx2 and handheld assist and bilateral knee block. Pt returned to supine in bed w/ minA for LE transfer due to increased pain and weakness. Pt will benefit from continued skilled PT in order to improve LE strength, mobility, gait, decrease c/o pain, and restore PLOF. Current discharge recommendation to SNF is appropriate due to the level of assistance required by the patient to ensure safety and improve overall function. ? ?   ? ?Recommendations for follow up therapy are one component of a multi-disciplinary discharge planning process, led by the attending physician.   Recommendations may be updated based on patient status, additional functional criteria and insurance authorization. ? ?Follow Up Recommendations Skilled nursing-short term rehab (<3 hours/day) ? ?  ?Assistance Recommended at Discharge Intermittent Supervision/Assistance  ?Patient can return home with the following ? Two people to help with walking and/or transfers;Two people to help with bathing/dressing/bathroom;Assistance with cooking/housework;Assist for transportation;Help with stairs or ramp for entrance ? ?  ?Equipment Recommendations None recommended by PT  ?Recommendations for Other Services ?    ?  ?Functional Status Assessment Patient has had a recent decline in their functional status and demonstrates the ability to make significant improvements in function in a reasonable and predictable amount of time.  ? ?  ?Precautions / Restrictions Precautions ?Precautions: Fall ?Restrictions ?Weight Bearing Restrictions: No  ? ?  ? ?Mobility ? Bed Mobility ?Overal bed mobility: Needs Assistance ?Bed Mobility: Supine to Sit, Sit to Supine ?  ?  ?Supine to sit: Min assist ?Sit to supine: Min assist ?  ?General bed mobility comments: minA for trunk elevation and LE transfers ?  ? ?Transfers ?Overall transfer level: Needs assistance ?Equipment used: Rolling walker (2 wheels), 2 person hand held assist ?Transfers: Sit to/from Stand ?Sit to Stand: Max assist, +2 physical assistance, From elevated surface ?  ?  ?  ?  ?  ?  ?  ? ?Ambulation/Gait ?  ?  ?Assistive device: Rolling walker (2 wheels) ?  ?  ?  ?  ?  ? ?Stairs ?  ?  ?  ?  ?  ? ?  Wheelchair Mobility ?  ? ?Modified Rankin (Stroke Patients Only) ?  ? ?  ? ?Balance Overall balance assessment: Needs assistance ?Sitting-balance support: Feet supported, Bilateral upper extremity supported, Single extremity supported ?Sitting balance-Leahy Scale: Good ?  ?  ?  ?Standing balance-Leahy Scale: Poor ?Standing balance comment: unable to assess due to c/o pain w/ sit to stand  attempts ?  ?  ?  ?  ?  ?  ?  ?  ?  ?  ?  ?   ? ? ? ?Pertinent Vitals/Pain Pain Assessment ?Pain Assessment: Faces ?Faces Pain Scale: Hurts whole lot ?Pain Descriptors / Indicators: Aching, Guarding, Throbbing, Grimacing, Discomfort, Moaning, Sore ?Pain Intervention(s): Limited activity within patient's tolerance, Monitored during session  ? ? ?Home Living Family/patient expects to be discharged to:: Private residence ?Living Arrangements: Children ?Available Help at Discharge: Family;Available 24 hours/day ?Type of Home: Apartment (second floor apartment) ?Home Access: Stairs to enter ?Entrance Stairs-Rails: Left;Right ?Entrance Stairs-Number of Steps: 12 ?  ?Home Layout: One level ?Home Equipment: Agricultural consultant (2 wheels) ?   ?  ?Prior Function Prior Level of Function : Independent/Modified Independent ?  ?  ?  ?  ?  ?  ?  ?  ?  ? ? ?Hand Dominance  ?   ? ?  ?Extremity/Trunk Assessment  ? Upper Extremity Assessment ?Upper Extremity Assessment: Generalized weakness;RUE deficits/detail;LUE deficits/detail ?RUE: Unable to fully assess due to pain ?RUE Sensation: WNL ?RUE Coordination: WNL ?LUE: Unable to fully assess due to pain ?LUE Sensation: WNL ?LUE Coordination: WNL ?  ? ?Lower Extremity Assessment ?Lower Extremity Assessment: Generalized weakness;RLE deficits/detail;LLE deficits/detail ?RLE: Unable to fully assess due to pain ?RLE Sensation: WNL ?RLE Coordination: WNL ?LLE: Unable to fully assess due to pain ?LLE Sensation: WNL ?LLE Coordination: WNL ?  ? ?   ?Communication  ?    ?Cognition Arousal/Alertness: Awake/alert ?Behavior During Therapy: [facility] for tasks assessed/performed, Anxious ?Overall Cognitive Status: Within Functional Limits for tasks assessed ?  ?  ?  ?  ?  ?  ?  ?  ?  ?  ?  ?  ?  ?  ?  ?  ?General Comments: anxious about mobility/transfers due to c/o pain ?  ?  ? ?  ?General Comments   ? ?  ?Exercises    ? ?Assessment/Plan  ?  ?PT Assessment Patient needs continued PT services  ?PT Problem List  Decreased strength;Decreased mobility;Decreased range of motion;Decreased coordination;Decreased activity tolerance;Pain;Obesity ? ?   ?  ?PT Treatment Interventions DME instruction;Therapeutic exercise;Gait training;Balance training;Stair training;Neuromuscular re-education;Functional mobility training;Therapeutic activities;Patient/family education   ? ?PT Goals (Current goals can be found in the Care Plan section)  ?Acute Rehab PT Goals ?Patient Stated Goal: to decrease pain and get stronger to go home ?PT Goal Formulation: With patient ?Time For Goal Achievement: 01/13/22 ?Potential to Achieve Goals: Fair ? ?  ?Frequency Min 2X/week ?  ? ? ?Co-evaluation   ?  ?  ?  ?  ? ? ?  ?AM-PAC PT "6 Clicks" Mobility  ?Outcome Measure Help needed turning from your back to your side while in a flat bed without using bedrails?: A Lot ?Help needed moving from lying on your back to sitting on the side of a flat bed without using bedrails?: A Lot ?Help needed moving to and from a bed to a chair (including a wheelchair)?: A Lot ?Help needed standing up from a chair using your arms (e.g., wheelchair or bedside chair)?: Total ?Help needed to walk  in hospital room?: Total ?Help needed climbing 3-5 steps with a railing? : Total ?6 Click Score: 9 ? ?  ?End of Session Equipment Utilized During Treatment: Gait belt ?Activity Tolerance: Patient limited by pain ?Patient left: in bed;with call bell/phone within reach;with bed alarm set ?Nurse Communication: Mobility status ?PT Visit Diagnosis: History of falling (Z91.81);Other abnormalities of gait and mobility (R26.89);Unsteadiness on feet (R26.81);Pain;Muscle weakness (generalized) (M62.81) ?Pain - Right/Left: Left ?Pain - part of body: Shoulder;Arm;Hand;Knee;Ankle and joints of foot ?  ? ?Time: 6213-0865 ?PT Time Calculation (min) (ACUTE ONLY): 34 min ? ? ?Charges:     ?  ?  ?   ? ?Jeralyn Bennett, SPT ?12/30/2021, 12:53 PM ? ?

## 2021-12-31 LAB — BLOOD CULTURE ID PANEL (REFLEXED) - BCID2

## 2021-12-31 LAB — GLUCOSE, CAPILLARY
Glucose-Capillary: 108 mg/dL — ABNORMAL HIGH (ref 70–99)
Glucose-Capillary: 113 mg/dL — ABNORMAL HIGH (ref 70–99)
Glucose-Capillary: 132 mg/dL — ABNORMAL HIGH (ref 70–99)
Glucose-Capillary: 135 mg/dL — ABNORMAL HIGH (ref 70–99)
Glucose-Capillary: 139 mg/dL — ABNORMAL HIGH (ref 70–99)
Glucose-Capillary: 143 mg/dL — ABNORMAL HIGH (ref 70–99)

## 2021-12-31 LAB — BASIC METABOLIC PANEL
Anion gap: 9 (ref 5–15)
BUN: 21 mg/dL — ABNORMAL HIGH (ref 6–20)
CO2: 27 mmol/L (ref 22–32)
Calcium: 9.2 mg/dL (ref 8.9–10.3)
Chloride: 102 mmol/L (ref 98–111)
Creatinine, Ser: 1.41 mg/dL — ABNORMAL HIGH (ref 0.44–1.00)
GFR, Estimated: 44 mL/min — ABNORMAL LOW (ref >60)
Glucose, Bld: 128 mg/dL — ABNORMAL HIGH (ref 70–99)
Potassium: 4.2 mmol/L (ref 3.5–5.1)
Sodium: 138 mmol/L (ref 135–145)

## 2021-12-31 LAB — CHLAMYDIA/NGC RT PCR (ARMC ONLY)
Chlamydia Tr: NOT DETECTED
N gonorrhoeae: NOT DETECTED

## 2021-12-31 LAB — HEPATITIS C ANTIBODY: HCV Ab: NONREACTIVE

## 2021-12-31 LAB — CBC
HCT: 28.3 % — ABNORMAL LOW (ref 36.0–46.0)
Hemoglobin: 8.5 g/dL — ABNORMAL LOW (ref 12.0–15.0)
MCH: 25.6 pg — ABNORMAL LOW (ref 26.0–34.0)
MCHC: 30 g/dL (ref 30.0–36.0)
MCV: 85.2 fL (ref 80.0–100.0)
Platelets: 407 10*3/uL — ABNORMAL HIGH (ref 150–400)
RBC: 3.32 MIL/uL — ABNORMAL LOW (ref 3.87–5.11)
RDW: 15 % (ref 11.5–15.5)
WBC: 15.3 10*3/uL — ABNORMAL HIGH (ref 4.0–10.5)
nRBC: 0 % (ref 0.0–0.2)

## 2021-12-31 LAB — HEPATITIS B SURFACE ANTIGEN: Hepatitis B Surface Ag: NONREACTIVE

## 2021-12-31 LAB — HIV ANTIBODY (ROUTINE TESTING W REFLEX): HIV Screen 4th Generation wRfx: NONREACTIVE

## 2021-12-31 MED ORDER — CHLORTHALIDONE 25 MG PO TABS
25.0000 mg | ORAL_TABLET | Freq: Every day | ORAL | Status: DC
  Administered 2021-12-31 – 2022-01-03 (×4): 25 mg via ORAL
  Filled 2021-12-31 (×4): qty 1

## 2021-12-31 MED ORDER — CEFAZOLIN SODIUM-DEXTROSE 1-4 GM/50ML-% IV SOLN
1.0000 g | Freq: Three times a day (TID) | INTRAVENOUS | Status: DC
  Administered 2021-12-31 – 2022-01-02 (×5): 1 g via INTRAVENOUS
  Filled 2021-12-31 (×6): qty 50

## 2021-12-31 NOTE — TOC Progression Note (Signed)
Transition of Care (TOC) - Progression Note  ? ? ?Patient Details  ?Name: Terry Noble  ?MRN: [id number] ?Date of Birth: 10/16/1966 ? ?Transition of Care (TOC) CM/SW Contact  ?Chapman FitchStephanie T Ailana Cuadrado, RN ?Phone Number: ?12/31/2021, 9:17 AM ? ?Clinical Narrative:    ?No SNF bed offers  ? ? ?Expected Discharge Plan: Skilled Nursing Facility ?Barriers to Discharge: Continued Medical Work up ? ?Expected Discharge Plan and Services ?Expected Discharge Plan: Skilled Nursing Facility ?  ?Discharge Planning Services: CM Consult ?  ?  ?                ?  ?  ?  ?  ?  ?  ?  ?  ?  ?  ? ? ?Social Determinants of Health (SDOH) Interventions ?  ? ?Readmission Risk Interventions ?No flowsheet data found. ? ?

## 2021-12-31 NOTE — Progress Notes (Signed)
PHARMACY - PHYSICIAN COMMUNICATION ?CRITICAL VALUE ALERT - BLOOD CULTURE IDENTIFICATION (BCID) ? ?Terry Noble is an 56 y.o. female who presented to [facility] on 12/29/2021 with a chief complaint of weakness ? ?Assessment:  blood culture from 3/6 with GPC in 1 of 4 bottles.  BCID detects MSSE.  ? ?Name of physician (or Provider) Contacted: Dr Ashok Pall ? ?Current antibiotics: None ? ?Changes to prescribed antibiotics recommended:  ?Recommendations accepted by provider likely contaminant,  repeat blood cx ? ?Results for orders placed or performed during the hospital encounter of 12/29/21  ?Blood Culture ID Panel (Reflexed) (Collected: 12/30/2021 10:34 AM)  ?Result Value Ref Range  ? Enterococcus faecalis NOT DETECTED NOT DETECTED  ? Enterococcus Faecium NOT DETECTED NOT DETECTED  ? Listeria monocytogenes NOT DETECTED NOT DETECTED  ? Staphylococcus species DETECTED (A) NOT DETECTED  ? Staphylococcus aureus (BCID) NOT DETECTED NOT DETECTED  ? Staphylococcus epidermidis DETECTED (A) NOT DETECTED  ? Staphylococcus lugdunensis NOT DETECTED NOT DETECTED  ? Streptococcus species NOT DETECTED NOT DETECTED  ? Streptococcus agalactiae NOT DETECTED NOT DETECTED  ? Streptococcus pneumoniae NOT DETECTED NOT DETECTED  ? Streptococcus pyogenes NOT DETECTED NOT DETECTED  ? A.calcoaceticus-baumannii NOT DETECTED NOT DETECTED  ? Bacteroides fragilis NOT DETECTED NOT DETECTED  ? Enterobacterales NOT DETECTED NOT DETECTED  ? Enterobacter cloacae complex NOT DETECTED NOT DETECTED  ? Escherichia coli NOT DETECTED NOT DETECTED  ? Klebsiella aerogenes NOT DETECTED NOT DETECTED  ? Klebsiella oxytoca NOT DETECTED NOT DETECTED  ? Klebsiella pneumoniae NOT DETECTED NOT DETECTED  ? Proteus species NOT DETECTED NOT DETECTED  ? Salmonella species NOT DETECTED NOT DETECTED  ? Serratia marcescens NOT DETECTED NOT DETECTED  ? Haemophilus influenzae NOT DETECTED NOT DETECTED  ? Neisseria meningitidis NOT DETECTED NOT DETECTED  ? Pseudomonas aeruginosa NOT  DETECTED NOT DETECTED  ? Stenotrophomonas maltophilia NOT DETECTED NOT DETECTED  ? Candida albicans NOT DETECTED NOT DETECTED  ? Candida auris NOT DETECTED NOT DETECTED  ? Candida glabrata NOT DETECTED NOT DETECTED  ? Candida krusei NOT DETECTED NOT DETECTED  ? Candida parapsilosis NOT DETECTED NOT DETECTED  ? Candida tropicalis NOT DETECTED NOT DETECTED  ? Cryptococcus neoformans/gattii NOT DETECTED NOT DETECTED  ? Methicillin resistance mecA/C NOT DETECTED NOT DETECTED  ? ?Juliette Alcide, PharmD, BCPS, BCIDP ?Work Cell: [phone] ?12/31/2021 12:59 PM ? ? ? ?

## 2021-12-31 NOTE — Progress Notes (Addendum)
Patient continues to go from green to yellow MEWs due to heartrate.  Patient denies any other issues. MD aware.Continue cardiac monitoring. Continue q4 vital signs. ?

## 2021-12-31 NOTE — Progress Notes (Signed)
PT Cancellation Note ? ?Patient Details ?Name: Terry PealsSherma  ?MRN: [id number] ?DOB: 12/06/1965 ? ? ?Cancelled Treatment:    Reason Eval/Treat Not Completed: Other (comment) Pt refused PT due to "I am in more pain today than I was yesterday when we worked together". PT will attempt at a later date/time. ? ? ?Jeralyn BennettAndrew Renelda Kilian, SPT ?12/31/2021, 2:27 PM ?

## 2021-12-31 NOTE — Plan of Care (Signed)
?  Problem: Education: ?Goal: Knowledge of General Education information will improve ?Description: Including pain rating scale, medication(s)/side effects and non-pharmacologic comfort measures ?Outcome: Progressing ?  ?Problem: Health Behavior/Discharge Planning: ?Goal: Ability to manage health-related needs will improve ?Outcome: Progressing ?  ?Problem: Clinical Measurements: ?Goal: Ability to maintain clinical measurements within normal limits will improve ?Outcome: Progressing ?Goal: Will remain free from infection ?Outcome: Progressing ?Goal: Diagnostic test results will improve ?Outcome: Progressing ?Goal: Respiratory complications will improve ?Outcome: Progressing ?Goal: Cardiovascular complication will be avoided ?Outcome: Progressing ?  ?Problem: Activity: ?Goal: Risk for activity intolerance will decrease ?Outcome: Not Progressing ?  ?Problem: Nutrition: ?Goal: Adequate nutrition will be maintained ?Outcome: Progressing ?  ?Problem: Elimination: ?Goal: Will not experience complications related to bowel motility ?Outcome: Progressing ?  ?Problem: Pain Managment: ?Goal: General experience of comfort will improve ?Outcome: Progressing ?  ?Problem: Safety: ?Goal: Ability to remain free from injury will improve ?Outcome: Progressing ?  ?Problem: Skin Integrity: ?Goal: Risk for impaired skin integrity will decrease ?Outcome: Progressing ?  ?

## 2021-12-31 NOTE — Progress Notes (Signed)
OT Cancellation Note ? ?Patient Details ?Name: Terry Noble  ?MRN: [id number] ?DOB: 08/03/1966 ? ? ?Cancelled Treatment:    Reason Eval/Treat Not Completed: Pain limiting ability to participate. Pt supine in bed and and refusing all aspects of therapeutic intervention even with encouragement. Pt then reports she is having 10/10 pain and recently received medication but continues to declined. OT will follow up when next available.  ? ?Jackquline DenmarkKatie Elysse Polidore, MS, OTR/L , CBIS ?ascom [phone]  ?12/31/21, 2:21 PM  ?

## 2021-12-31 NOTE — Progress Notes (Signed)
Subjective:  Patient reports pain as moderate.  Sitting up in bed, her sister is in the room. She states her whole body aches. No specific joint complaints.  Objective:   VITALS:   Vitals:   12/30/21 1540 12/30/21 2030 12/31/21 0326 12/31/21 0811  BP: 135/60 135/75 (!) 147/89 (!) 165/77  Pulse: (!) 120 (!) 116 (!) 103 (!) 102  Resp: 20 20 18 20   Temp: 100 F (37.8 C) 99.4 F (37.4 C) 99.2 F (37.3 C) 98.2 F (36.8 C)  TempSrc:      SpO2: 95% 94% 96% 97%  Weight:      Height:        PHYSICAL EXAM:  ABD soft Neurovascular intact Sensation intact distally Dorsiflexion/Plantar flexion intact No cellulitis present Compartment soft  LABS  Results for orders placed or performed during the hospital encounter of 12/29/21 (from the past 24 hour(s))  Glucose, capillary     Status: Abnormal   Collection Time: 12/30/21  3:44 PM  Result Value Ref Range   Glucose-Capillary 147 (H) 70 - 99 mg/dL   Comment 1 Notify RN    Comment 2 Document in Chart   Glucose, capillary     Status: Abnormal   Collection Time: 12/30/21  8:24 PM  Result Value Ref Range   Glucose-Capillary 124 (H) 70 - 99 mg/dL  Glucose, capillary     Status: Abnormal   Collection Time: 12/31/21 12:13 AM  Result Value Ref Range   Glucose-Capillary 132 (H) 70 - 99 mg/dL  Glucose, capillary     Status: Abnormal   Collection Time: 12/31/21  3:46 AM  Result Value Ref Range   Glucose-Capillary 135 (H) 70 - 99 mg/dL  CBC     Status: Abnormal   Collection Time: 12/31/21  4:23 AM  Result Value Ref Range   WBC 15.3 (H) 4.0 - 10.5 K/uL   RBC 3.32 (L) 3.87 - 5.11 MIL/uL   Hemoglobin 8.5 (L) 12.0 - 15.0 g/dL   HCT 28.3 (L) 36.0 - 46.0 %   MCV 85.2 80.0 - 100.0 fL   MCH 25.6 (L) 26.0 - 34.0 pg   MCHC 30.0 30.0 - 36.0 g/dL   RDW 15.0 11.5 - 15.5 %   Platelets 407 (H) 150 - 400 K/uL   nRBC 0.0 0.0 - 0.2 %  Basic metabolic panel     Status: Abnormal   Collection Time: 12/31/21  4:23 AM  Result Value Ref Range    Sodium 138 135 - 145 mmol/L   Potassium 4.2 3.5 - 5.1 mmol/L   Chloride 102 98 - 111 mmol/L   CO2 27 22 - 32 mmol/L   Glucose, Bld 128 (H) 70 - 99 mg/dL   BUN 21 (H) 6 - 20 mg/dL   Creatinine, Ser 1.41 (H) 0.44 - 1.00 mg/dL   Calcium 9.2 8.9 - 10.3 mg/dL   GFR, Estimated 44 (L) >60 mL/min   Anion gap 9 5 - 15  Hepatitis C antibody     Status: None   Collection Time: 12/31/21  4:23 AM  Result Value Ref Range   HCV Ab NON REACTIVE NON REACTIVE  .Hepatitis B Surface Antigen     Status: None   Collection Time: 12/31/21  4:23 AM  Result Value Ref Range   Hepatitis B Surface Ag NON REACTIVE NON REACTIVE  HIV Antibody (routine testing w rflx)     Status: None   Collection Time: 12/31/21  4:23 AM  Result Value Ref Range  HIV Screen 4th Generation wRfx Non Reactive Non Reactive  Glucose, capillary     Status: Abnormal   Collection Time: 12/31/21  8:12 AM  Result Value Ref Range   Glucose-Capillary 113 (H) 70 - 99 mg/dL   Comment 1 Notify RN    Comment 2 Document in Chart   Glucose, capillary     Status: Abnormal   Collection Time: 12/31/21 11:40 AM  Result Value Ref Range   Glucose-Capillary 108 (H) 70 - 99 mg/dL   Comment 1 Notify RN    Comment 2 Document in Chart     DG Shoulder Right  Result Date: 12/30/2021 CLINICAL DATA:  Right shoulder pain.  Known injury. EXAM: RIGHT SHOULDER - 2+ VIEW COMPARISON:  None available FINDINGS: Mild acromioclavicular joint space narrowing and peripheral osteophytosis degenerative change. Minimal inferior glenoid and humeral head-neck junction degenerative osteophytosis. Mild-to-moderate cystic change within the superolateral humeral head, possibly from chronic subacromial impingement. Mild distal lateral subacromial spurring. No acute fracture or dislocation. The visualized portion of the right lung is unremarkable. 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 Electronically Signed   By: Yvonne Kendall M.D.   On:  12/30/2021 11:12   DG Wrist 2 Views Right  Result Date: 12/30/2021 CLINICAL DATA:  Right wrist pain for 3 days.  No known injury. EXAM: RIGHT WRIST - 2 VIEW COMPARISON:  None. FINDINGS: There is relative widening of the scapholunate interval measuring approximately 4 mm compared to the lunotriquetral interval measuring 1 mm. Moderate triscaphe joint and thumb carpometacarpal joint space narrowing and peripheral osteophytosis degenerative change. Neutral ulnar variance. No acute fracture or dislocation. IMPRESSION:: IMPRESSION: 1. 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 2. Moderate triscaphe joint and thumb carpometacarpal joint osteoarthritis. Electronically Signed   By: Yvonne Kendall M.D.   On: 12/30/2021 11:11   DG Ankle 2 Views Left  Result Date: 12/30/2021 CLINICAL DATA:  Three days of left ankle pain. EXAM: LEFT ANKLE - 2 VIEW COMPARISON:  None. FINDINGS: Moderate to large calcaneal heel spur. Mild-to-moderate chronic spurring at the Achilles insertion on the calcaneus. The ankle mortise is symmetric and intact. Moderate diffuse ankle soft tissue swelling possibly systemic. There are calcifications overlying the posterior greater than anterior aspects of the distal calf which may be secondary to chronic stasis/subcutaneous edema. Moderate dorsal midfoot degenerative osteophytes. No acute fracture is seen. No dislocation. IMPRESSION:: IMPRESSION: 1. Moderate to large calcaneal heel spur. 2. Moderate dorsal midfoot osteoarthritis. 3. Soft tissue swelling and calcifications possibly related to chronic stasis/subcutaneous edema. Electronically Signed   By: Yvonne Kendall M.D.   On: 12/30/2021 11:07   DG Ankle 2 Views Right  Result Date: 12/30/2021 CLINICAL DATA:  Right ankle pain for 3 days. EXAM: RIGHT ANKLE - 2 VIEW COMPARISON:  None. FINDINGS: Mild degenerative spurring extending medially from the mid height of the medial malleolus. The ankle  mortise is symmetric and intact. Mild distal fibula and adjacent lateral talar degenerative osteophytes. Moderate dorsal talonavicular and navicular-cuneiform degenerative spurring. Mild-to-moderate calcaneal heel spur. Mild chronic spurring at the Achilles insertion on the calcaneus. Mild anterior tibiotalar joint space narrowing. Likely pes planus. Moderate diffuse ankle soft tissue swelling. Calcifications throughout the visualized calf greatest posteriorly and medially. Findings are suggestive of the sequela of chronic stasis/subcutaneous edema. IMPRESSION:: IMPRESSION: 1. Mild-to-moderate calcaneal heel spur. 2. Moderate midfoot osteoarthritis. 3. Moderate diffuse soft tissue swelling with calcifications suggesting the sequela of chronic stasis/subcutaneous edema. Electronically Signed   By: Yvonne Kendall  M.D.   On: 12/30/2021 11:09   MR BRAIN WO CONTRAST  Result Date: 12/29/2021 CLINICAL DATA:  Neuro deficit, acute, stroke suspected. EXAM: MRI HEAD WITHOUT CONTRAST TECHNIQUE: Multiplanar, multiecho pulse sequences of the brain and surrounding structures were obtained without intravenous contrast. COMPARISON:  Head CT 12/29/2021. FINDINGS: Brain: Cerebral volume is normal. Mild multifocal T2 FLAIR hyperintense signal abnormality within the cerebral white matter, nonspecific but compatible with chronic small vessel ischemic disease. No cortical encephalomalacia is identified. There is no acute infarct. No evidence of an intracranial mass. No chronic intracranial blood products. No extra-axial fluid collection. No midline shift. Vascular: Maintained flow voids within the proximal large arterial vessels. Skull and upper cervical spine: No focal suspicious marrow lesion. Sinuses/Orbits: Visualized orbits show no acute finding. No significant paranasal sinus disease. IMPRESSION: No evidence of acute intracranial abnormality. Mild chronic small vessel ischemic changes within the cerebral white matter.  Electronically Signed   By: Kellie Simmering D.O.   On: 12/29/2021 14:25   ECHOCARDIOGRAM COMPLETE  Result Date: 12/30/2021    ECHOCARDIOGRAM REPORT   Patient Name:   Terry Noble Date of Exam: 12/29/2021 Medical Rec #:  [id number]    Height:       65.0 in Accession #:    [id number]   Weight:       274.0 lb Date of Birth:  [birth year]     BSA:          2.261 m Patient Age:    56 years     BP:           146/71 mmHg Patient Gender: F            HR:           111 bpm. Exam Location:  [facility] Procedure: 2D Echo, Cardiac Doppler and Color Doppler Indications:     Stroke I63.9  History:         Patient has prior history of Echocardiogram examinations. CHF;                  Risk Factors:Hypertension and Diabetes.  Sonographer:     Alyse Low Roar Referring Phys:  [id number] Diagnosing Phys: Donnelly Angelica IMPRESSIONS  1. Left ventricular ejection fraction, by estimation, is 55 to 60%. The left ventricle has normal function. The left ventricle has no regional wall motion abnormalities. The left ventricular internal cavity size was mildly dilated. Indeterminate diastolic filling due to E-A fusion.  2. Right ventricular systolic function was not well visualized. The right ventricular size is not well visualized.  3. The mitral valve is normal in structure. No evidence of mitral valve regurgitation.  4. The aortic valve is normal in structure. Aortic valve regurgitation is not visualized. No aortic stenosis is present. Conclusion(s)/Recommendation(s): TECHNICALLY DIFFICULT STUDY. FINDINGS  Left Ventricle: Left ventricular ejection fraction, by estimation, is 55 to 60%. The left ventricle has normal function. The left ventricle has no regional wall motion abnormalities. The left ventricular internal cavity size was mildly dilated. There is  no left ventricular hypertrophy. Indeterminate diastolic filling due to E-A fusion. Right Ventricle: The right ventricular size is not well visualized. Right ventricular systolic function was  not well visualized. Left Atrium: Left atrial size was normal in size. Right Atrium: Right atrial size was not well visualized. Pericardium: There is no evidence of pericardial effusion. Mitral Valve: The mitral valve is normal in structure. No evidence of mitral valve regurgitation. Tricuspid Valve: The tricuspid valve is not well visualized. Aortic  Valve: The aortic valve is normal in structure. Aortic valve regurgitation is not visualized. No aortic stenosis is present. Aortic valve peak gradient measures 10.6 mmHg. Pulmonic Valve: The pulmonic valve was not well visualized. Pulmonic valve regurgitation is not visualized. No evidence of pulmonic stenosis. Aorta: The aortic root and ascending aorta are structurally normal, with no evidence of dilitation. Venous: The inferior vena cava was not well visualized. IAS/Shunts: The interatrial septum was not well visualized.  LEFT VENTRICLE PLAX 2D LVIDd:         5.22 cm   Diastology LVIDs:         3.72 cm   LV e' medial:    6.20 cm/s LV PW:         1.01 cm   LV E/e' medial:  12.7 LV IVS:        1.06 cm   LV e' lateral:   9.90 cm/s LVOT diam:     2.00 cm   LV E/e' lateral: 8.0 LVOT Area:     3.14 cm  RIGHT VENTRICLE RV Mid diam:    3.29 cm RV S prime:     16.80 cm/s TAPSE (M-mode): 2.1 cm LEFT ATRIUM             Index LA diam:        3.90 cm 1.72 cm/m LA Vol (A2C):   42.9 ml 18.97 ml/m LA Vol (A4C):   39.5 ml 17.47 ml/m LA Biplane Vol: 41.3 ml 18.26 ml/m  AORTIC VALVE                 PULMONIC VALVE AV Area (Vmax): 2.52 cm     PV Vmax:        1.24 m/s AV Vmax:        163.00 cm/s  PV Peak grad:   6.2 mmHg AV Peak Grad:   10.6 mmHg    RVOT Peak grad: 4 mmHg LVOT Vmax:      131.00 cm/s  AORTA Ao Root diam: 2.60 cm Ao Asc diam:  2.90 cm MITRAL VALVE MV Area (PHT): 20.50 cm   SHUNTS MV Decel Time: 37 msec     Systemic Diam: 2.00 cm MV E velocity: 79.00 cm/s MV A Prime:    11.6 cm/s Donnelly Angelica Electronically signed by Donnelly Angelica Signature Date/Time: 12/30/2021/1:05:21 PM     Final     Assessment/Plan:     Principal Problem:   Weakness Active Problems:   CHF (congestive heart failure) (HCC)   Diabetes (HCC)   HTN (hypertension)   Obstructive sleep apnea   Morbid obesity (HCC)   Depression   CKD (chronic kidney disease) stage 3, GFR 30-59 ml/min (HCC)   SIRS (systemic inflammatory response syndrome) (HCC)   Up with therapy No evidence of specific joint pain that would be concerning for septic arthritis at this time. Please call if she develops any new joint symptoms. Would recommend fluoroscopic aspiration as needed.   Lovell Sheehan , MD 12/31/2021, 1:23 PM

## 2021-12-31 NOTE — Progress Notes (Addendum)
PROGRESS NOTE    Terry Noble  NWG:[id number] DOB: Apr 27, 1966 DOA: 12/29/2021 PCP: Oswaldo Conroy, MD  Outpatient Specialists: cardiology    Brief Narrative:   From admission h and p Terry Noble is a 56 y.o. female with medical history significant for morbid obesity, hypertension, diabetes mellitus, sleep apnea and CHF who presents to the ER for evaluation of a 2-day history of left-sided weakness. Patient notes that she has had numbness and tingling in her left upper and lower extremity since Friday and thought it was an allergic reaction.  She took her EpiPen and Benadryl without any improvement and so she called EMS. She denies having any difficulty swallowing or slurred speech.  She complains of blurred vision but denies having any headache, no dizziness, no lightheadedness, no nausea or vomiting.  She states that she rolled out of bed this morning and landed on the floor and could not get up due to the weakness.  She also voided on herself because she was too weak to get up. She denies having any chest pain, no shortness of breath, no changes in her bowel habits, no back pain, no fever, no cough, no chills, no urinary symptoms. Initial CT scan of the head without contrast does not show an acute bleed.  Not noted at time of admission but patient was recently admitted to [facility], discharged to [facility], recently returned home. Admission was for uti/cellulitis with sepsis, required pressors, also had hhs, also new dvt and aki.   Assessment & Plan:   Principal Problem:   Weakness Active Problems:   CHF (congestive heart failure) (HCC)   Diabetes (HCC)   HTN (hypertension)   Obstructive sleep apnea   Morbid obesity (HCC)   Depression   CKD (chronic kidney disease) stage 3, GFR 30-59 ml/min (HCC)   SIRS (systemic inflammatory response syndrome) (HCC)  # Weakness Patient presents with left sided weakness with initial concern for stroke. Review of previous admission note shows this to be  chronic. Ct and mri are negative, this does not appear to be stroke - PT/OT following, advising SNF - TOC searching for bed  # SIRS # Bacteriuria Recent admission for cellulitis and uti sepsis, treated. Here febrile, tachycardic. Last fever AM 3/6. no signs cellulitis. Doesn't endorse uti symptoms but urine is growing GNRs, had e coli at [facility] in January that was pan sensitive. No blood in urine to suggest stone, no abd pain. No diarrhea. Some concern for septic joint yesterday but has diffuse joint pains, x-rays showing only chronic OA changes, today better able to move them, and ortho thinks septic arthritis less likely.  No respiratory symptoms, cxr clear, covid/flu neg. No AMS or neck pain/stiffness to suggest meningitis. Possible rheumatologic problem? 1/4 blood cultures growing staph epi, a likely contaminant. No thrombus on TTE. Uric acid is mildly elevated. Inflammatory markers are elevated - repeat set of blood cultures - start cefazolin for uti - f/u urine culture sensitivities - d/c fluids, po is adequate - f/u ANA, RF, anti-ccp, g/c for reactive arthritis  # DVT Recent diagnosis, on eliquis - cont eliquis  # MDD - home abilify  # Neuropathy - home gabapentin  # T2DM Recent admit for hhs. Here glucose wnl - semglee basal - SSI  # CKD 3b Cr 1.4 is at baseline  # Anemia, normocytic Hgb 8s is at baseline - f/u iron panel  # HFpEF Appears euvolemic. TTE stable  # HTN Here mild elevation - homne lisinopril - resume home chlorthalidone  #  Neuropathy - home gabapentin  # OSA - cpap qhs   DVT prophylaxis: apixaban Code Status: full Family Communication: sister updated @ bedside 3/7  Level of care: Med-Surg Status is: Inpatient Remains inpatient appropriate because: severity of illness    Consultants:  ortho  Procedures: none  Antimicrobials:  cefazolin   Subjective: Joint pains improving. No dysuria. No diarrhea. No abd pain, tolerating diet.  Last fever 24 hours ago  Objective: Vitals:   12/30/21 1540 12/30/21 2030 12/31/21 0326 12/31/21 0811  BP: 135/60 135/75 (!) 147/89 (!) 165/77  Pulse: (!) 120 (!) 116 (!) 103 (!) 102  Resp: Temp: 100 F (37.8 C) 99.4 F (37.4 C) 99.2 F (37.3 C) 98.2 F (36.8 C)  TempSrc:      SpO2: 95% 94% 96% 97%  Weight:      Height:        Intake/Output Summary (Last 24 hours) at 12/31/2021 1315 Last data filed at 12/31/2021 0830 Gross per 24 hour  Intake 180 ml  Output 1450 ml  Net -1270 ml   Filed Weights   12/29/21 0837 12/29/21 2041  Weight: 124.3 kg 120.3 kg    Examination:  General exam: holding right arm appears in mild pain Respiratory system: Clear to auscultation. Respiratory effort normal. Cardiovascular system: S1 & S2 heard, tachycardic. No JVD, murmurs, rubs, gallops or clicks.  Gastrointestinal system: Abdomen is obese, soft and nontender. No organomegaly or masses felt. Normal bowel sounds heard. Central nervous system: Alert and oriented. No focal neurological deficits. Extremities: Symmetric 5 x 5 power. Trace pedal edema Skin: No rashes, lesions or ulcers Psychiatry: Judgement and insight appear normal. Mood & affect appropriate.  MSK: improved mobility of arms and legs, no redness or swelling noted    Data Reviewed: I have personally reviewed following labs and imaging studies  CBC: Recent Labs  Lab 12/29/21 0843 12/31/21 0423  WBC 14.8* 15.3*  NEUTROABS 10.2*  --   HGB 8.6* 8.5*  HCT 29.0* 28.3*  MCV 87.6 85.2  PLT 383 407*   Basic Metabolic Panel: Recent Labs  Lab 12/29/21 0843 12/31/21 0423  NA 138 138  K 4.2 4.2  CL 102 102  CO2 23 27  GLUCOSE 176* 128*  BUN 22* 21*  CREATININE 1.41* 1.41*  CALCIUM 9.0 9.2   GFR: Estimated Creatinine Clearance: 59.3 mL/min (A) (by C-G formula based on SCr of 1.41 mg/dL (H)). Liver Function Tests: Recent Labs  Lab 12/29/21 0843  AST 18  ALT 13  ALKPHOS 68  BILITOT 0.8  PROT 7.9   ALBUMIN 3.5   No results for input(s): LIPASE, AMYLASE in the last 168 hours. No results for input(s): AMMONIA in the last 168 hours. Coagulation Profile: Recent Labs  Lab 12/30/21 1046  INR 1.2   Cardiac Enzymes: Recent Labs  Lab 12/29/21 0843  CKTOTAL 51   BNP (last 3 results) No results for input(s): PROBNP in the last 8760 hours. HbA1C: Recent Labs    12/30/21 0606  HGBA1C 11.8*   CBG: Recent Labs  Lab 12/30/21 2024 12/31/21 0013 12/31/21 0346 12/31/21 0812 12/31/21 1140  GLUCAP 124* 132* 135* 113* 108*   Lipid Profile: Recent Labs    12/30/21 0606  CHOL 136  HDL 34*  LDLCALC 86  TRIG 78  CHOLHDL 4.0   Thyroid Function Tests: No results for input(s): TSH, T4TOTAL, FREET4, T3FREE, THYROIDAB in the last 72 hours. Anemia Panel: Recent Labs    12/30/21 1046  FERRITIN  211  TIBC 281  IRON 20*   Urine analysis:    Component Value Date/Time   COLORURINE YELLOW (A) 12/29/2021 0940   APPEARANCEUR HAZY (A) 12/29/2021 0940   LABSPEC 1.016 12/29/2021 0940   PHURINE 9.0 (H) 12/29/2021 0940   GLUCOSEU NEGATIVE 12/29/2021 0940   HGBUR NEGATIVE 12/29/2021 0940   BILIRUBINUR NEGATIVE 12/29/2021 0940   KETONESUR NEGATIVE 12/29/2021 0940   PROTEINUR 30 (A) 12/29/2021 0940   NITRITE NEGATIVE 12/29/2021 0940   LEUKOCYTESUR NEGATIVE 12/29/2021 0940   Sepsis Labs: @LABRCNTIP (procalcitonin:4,lacticidven:4)  ) Recent Results (from the past 240 hour(s))  Resp Panel by RT-PCR (Flu A&B, Covid) Nasopharyngeal Swab     Status: None   Collection Time: 12/29/21  8:43 AM   Specimen: Nasopharyngeal Swab; Nasopharyngeal(NP) swabs in vial transport medium  Result Value Ref Range Status   SARS Coronavirus 2 by RT PCR NEGATIVE NEGATIVE Final    Comment: (NOTE) SARS-CoV-2 target nucleic acids are NOT DETECTED.  The SARS-CoV-2 RNA is generally detectable in upper respiratory specimens during the acute phase of infection. The lowest concentration of SARS-CoV-2 viral  copies this assay can detect is 138 copies/mL. A negative result does not preclude SARS-Cov-2 infection and should not be used as the sole basis for treatment or other patient management decisions. A negative result may occur with  improper specimen collection/handling, submission of specimen other than nasopharyngeal swab, presence of viral mutation(s) within the areas targeted by this assay, and inadequate number of viral copies(<138 copies/mL). A negative result must be combined with clinical observations, patient history, and epidemiological information. The expected result is Negative.  Fact Sheet for Patients:  BloggerCourse.com  Fact Sheet for Healthcare Providers:  SeriousBroker.it  This test is no t yet approved or cleared by the [place] FDA and  has been authorized for detection and/or diagnosis of SARS-CoV-2 by FDA under an Emergency Use Authorization (EUA). This EUA will remain  in effect (meaning this test can be used) for the duration of the COVID-19 declaration under Section 564(b)(1) of the Act, 21 U.S.C.section 360bbb-3(b)(1), unless the authorization is terminated  or revoked sooner.       Influenza A by PCR NEGATIVE NEGATIVE Final   Influenza B by PCR NEGATIVE NEGATIVE Final    Comment: (NOTE) The Xpert Xpress SARS-CoV-2/FLU/RSV plus assay is intended as an aid in the diagnosis of influenza from Nasopharyngeal swab specimens and should not be used as a sole basis for treatment. Nasal washings and aspirates are unacceptable for Xpert Xpress SARS-CoV-2/FLU/RSV testing.  Fact Sheet for Patients: BloggerCourse.com  Fact Sheet for Healthcare Providers: SeriousBroker.it  This test is not yet approved or cleared by the [place] FDA and has been authorized for detection and/or diagnosis of SARS-CoV-2 by FDA under an Emergency Use Authorization (EUA). This  EUA will remain in effect (meaning this test can be used) for the duration of the COVID-19 declaration under Section 564(b)(1) of the Act, 21 U.S.C. section 360bbb-3(b)(1), unless the authorization is terminated or revoked.  Performed at [facility], [address] [place], [place] [address]   Urine Culture     Status: Abnormal (Preliminary result)   Collection Time: 12/29/21  9:40 AM   Specimen: Urine, Clean Catch  Result Value Ref Range Status   Specimen Description   Final    URINE, CLEAN CATCH Performed at [facility], [address], [place], [place] [address]    Special Requests   Final    NONE Performed at [facility],  [address], [place], [place] [address]    Culture (A)  Final    >=100,000 COLONIES/mL GRAM NEGATIVE RODS SUSCEPTIBILITIES TO FOLLOW CULTURE REINCUBATED FOR BETTER GROWTH Performed at [facility], [address] [place], [place] [address]    Report Status PENDING  Incomplete  CULTURE, BLOOD (ROUTINE X 2) w Reflex to ID Panel     Status: None (Preliminary result)   Collection Time: 12/30/21 10:34 AM   Specimen: BLOOD  Result Value Ref Range Status   Specimen Description BLOOD RIGHT [facility]  Final   Special Requests   Final    BOTTLES DRAWN AEROBIC AND ANAEROBIC Blood Culture adequate volume   Culture  Setup Time   Final    GRAM POSITIVE COCCI AEROBIC BOTTLE ONLY CRITICAL RESULT CALLED TO, READ BACK BY AND VERIFIED WITH: Drusilla Kanner 12/31/21 1142 MW Performed at [facility], [address] [place], [place] [address]    Culture GRAM POSITIVE COCCI  Final   Report Status PENDING  Incomplete  Blood Culture ID Panel (Reflexed)     Status: Abnormal   Collection Time: 12/30/21 10:34 AM  Result Value Ref Range Status   Enterococcus faecalis NOT DETECTED NOT DETECTED Final   Enterococcus Faecium NOT DETECTED NOT DETECTED Final   Listeria monocytogenes NOT DETECTED NOT DETECTED Final   Staphylococcus  species DETECTED (A) NOT DETECTED Final    Comment: CRITICAL RESULT CALLED TO, READ BACK BY AND VERIFIED WITH: DEVAN MITCHELL 12/31/21 1143 MW    Staphylococcus aureus (BCID) NOT DETECTED NOT DETECTED Final   Staphylococcus epidermidis DETECTED (A) NOT DETECTED Final    Comment: CRITICAL RESULT CALLED TO, READ BACK BY AND VERIFIED WITH: DEVAN MITCHELL 12/31/21 1143 MW    Staphylococcus lugdunensis NOT DETECTED NOT DETECTED Final   Streptococcus species NOT DETECTED NOT DETECTED Final   Streptococcus agalactiae NOT DETECTED NOT DETECTED Final   Streptococcus pneumoniae NOT DETECTED NOT DETECTED Final   Streptococcus pyogenes NOT DETECTED NOT DETECTED Final   A.calcoaceticus-baumannii NOT DETECTED NOT DETECTED Final   Bacteroides fragilis NOT DETECTED NOT DETECTED Final   Enterobacterales NOT DETECTED NOT DETECTED Final   Enterobacter cloacae complex NOT DETECTED NOT DETECTED Final   Escherichia coli NOT DETECTED NOT DETECTED Final   Klebsiella aerogenes NOT DETECTED NOT DETECTED Final   Klebsiella oxytoca NOT DETECTED NOT DETECTED Final   Klebsiella pneumoniae NOT DETECTED NOT DETECTED Final   Proteus species NOT DETECTED NOT DETECTED Final   Salmonella species NOT DETECTED NOT DETECTED Final   Serratia marcescens NOT DETECTED NOT DETECTED Final   Haemophilus influenzae NOT DETECTED NOT DETECTED Final   Neisseria meningitidis NOT DETECTED NOT DETECTED Final   Pseudomonas aeruginosa NOT DETECTED NOT DETECTED Final   Stenotrophomonas maltophilia NOT DETECTED NOT DETECTED Final   Candida albicans NOT DETECTED NOT DETECTED Final   Candida auris NOT DETECTED NOT DETECTED Final   Candida glabrata NOT DETECTED NOT DETECTED Final   Candida krusei NOT DETECTED NOT DETECTED Final   Candida parapsilosis NOT DETECTED NOT DETECTED Final   Candida tropicalis NOT DETECTED NOT DETECTED Final   Cryptococcus neoformans/gattii NOT DETECTED NOT DETECTED Final   Methicillin resistance mecA/C NOT DETECTED  NOT DETECTED Final    Comment: Performed at [facility], [address] [place], [place] [address]  CULTURE, BLOOD (ROUTINE X 2) w Reflex to ID Panel     Status: None (Preliminary result)   Collection Time: 12/30/21 10:41 AM   Specimen: BLOOD  Result Value Ref  Range Status   Specimen Description BLOOD LEFT AC  Final   Special Requests   Final    BOTTLES DRAWN AEROBIC AND ANAEROBIC Blood Culture adequate volume   Culture   Final    NO GROWTH < 24 HOURS Performed at [facility], [address] [place], [place] [address]    Report Status PENDING  Incomplete         Radiology Studies: DG Shoulder Right  Result Date: 12/30/2021 CLINICAL DATA:  Right shoulder pain.  Known injury. EXAM: RIGHT SHOULDER - 2+ VIEW COMPARISON:  None available FINDINGS: Mild acromioclavicular joint space narrowing and peripheral osteophytosis degenerative change. Minimal inferior glenoid and humeral head-neck junction degenerative osteophytosis. Mild-to-moderate cystic change within the superolateral humeral head, possibly from chronic subacromial impingement. Mild distal lateral subacromial spurring. No acute fracture or dislocation. The visualized portion of the right lung is unremarkable. 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 Electronically Signed   By: Neita Garnetonald  Viola M.D.   On: 12/30/2021 11:12   DG Wrist 2 Views Right  Result Date: 12/30/2021 CLINICAL DATA:  Right wrist pain for 3 days.  No known injury. EXAM: RIGHT WRIST - 2 VIEW COMPARISON:  None. FINDINGS: There is relative widening of the scapholunate interval measuring approximately 4 mm compared to the lunotriquetral interval measuring 1 mm. Moderate triscaphe joint and thumb carpometacarpal joint space narrowing and peripheral osteophytosis degenerative change. Neutral ulnar variance. No acute fracture or dislocation. IMPRESSION:: IMPRESSION: 1. Widening of the scapholunate interval  suggesting scapholunate ligament deficiency, age indeterminate. 2. Moderate triscaphe joint and thumb carpometacarpal joint osteoarthritis. Electronically Signed   By: Neita Garnetonald  Viola M.D.   On: 12/30/2021 11:11   DG Ankle 2 Views Left  Result Date: 12/30/2021 CLINICAL DATA:  Three days of left ankle pain. EXAM: LEFT ANKLE - 2 VIEW COMPARISON:  None. FINDINGS: Moderate to large calcaneal heel spur. Mild-to-moderate chronic spurring at the Achilles insertion on the calcaneus. The ankle mortise is symmetric and intact. Moderate diffuse ankle soft tissue swelling possibly systemic. There are calcifications overlying the posterior greater than anterior aspects of the distal calf which may be secondary to chronic stasis/subcutaneous edema. Moderate dorsal midfoot degenerative osteophytes. No acute fracture is seen. No dislocation. IMPRESSION:: IMPRESSION: 1. Moderate to large calcaneal heel spur. 2. Moderate dorsal midfoot osteoarthritis. 3. Soft tissue swelling and calcifications possibly related to chronic stasis/subcutaneous edema. Electronically Signed   By: Neita Garnetonald  Viola M.D.   On: 12/30/2021 11:07   DG Ankle 2 Views Right  Result Date: 12/30/2021 CLINICAL DATA:  Right ankle pain for 3 days. EXAM: RIGHT ANKLE - 2 VIEW COMPARISON:  None. FINDINGS: Mild degenerative spurring extending medially from the mid height of the medial malleolus. The ankle mortise is symmetric and intact. Mild distal fibula and adjacent lateral talar degenerative osteophytes. Moderate dorsal talonavicular and navicular-cuneiform degenerative spurring. Mild-to-moderate calcaneal heel spur. Mild chronic spurring at the Achilles insertion on the calcaneus. Mild anterior tibiotalar joint space narrowing. Likely pes planus. Moderate diffuse ankle soft tissue swelling. Calcifications throughout the visualized calf greatest posteriorly and medially. Findings are suggestive of the sequela of chronic stasis/subcutaneous edema. IMPRESSION::  IMPRESSION: 1. Mild-to-moderate calcaneal heel spur. 2. Moderate midfoot osteoarthritis. 3. Moderate diffuse soft tissue swelling with calcifications suggesting the sequela of chronic stasis/subcutaneous edema. Electronically Signed   By: Neita Garnetonald  Viola M.D.   On: 12/30/2021 11:09   MR BRAIN WO CONTRAST  Result Date: 12/29/2021 CLINICAL DATA:  Neuro deficit, acute, stroke suspected. EXAM: MRI HEAD WITHOUT CONTRAST  TECHNIQUE: Multiplanar, multiecho pulse sequences of the brain and surrounding structures were obtained without intravenous contrast. COMPARISON:  Head CT 12/29/2021. FINDINGS: Brain: Cerebral volume is normal. Mild multifocal T2 FLAIR hyperintense signal abnormality within the cerebral white matter, nonspecific but compatible with chronic small vessel ischemic disease. No cortical encephalomalacia is identified. There is no acute infarct. No evidence of an intracranial mass. No chronic intracranial blood products. No extra-axial fluid collection. No midline shift. Vascular: Maintained flow voids within the proximal large arterial vessels. Skull and upper cervical spine: No focal suspicious marrow lesion. Sinuses/Orbits: Visualized orbits show no acute finding. No significant paranasal sinus disease. IMPRESSION: No evidence of acute intracranial abnormality. Mild chronic small vessel ischemic changes within the cerebral white matter. Electronically Signed   By: Jackey Loge D.O.   On: 12/29/2021 14:25   ECHOCARDIOGRAM COMPLETE  Result Date: 12/30/2021    ECHOCARDIOGRAM REPORT   Patient Name:   Terry Noble Date of Exam: 12/29/2021 Medical Rec #:  [id number]    Height:       65.0 in Accession #:    [id number]   Weight:       274.0 lb Date of Birth:  [birth year]     BSA:          2.261 m Patient Age:    55 years     BP:           146/71 mmHg Patient Gender: F            HR:           111 bpm. Exam Location:  [facility] Procedure: 2D Echo, Cardiac Doppler and Color Doppler Indications:     Stroke I63.9  History:          Patient has prior history of Echocardiogram examinations. CHF;                  Risk Factors:Hypertension and Diabetes.  Sonographer:     Neysa Bonito Roar Referring Phys:  [id number] Diagnosing Phys: Sena Slate IMPRESSIONS  1. Left ventricular ejection fraction, by estimation, is 55 to 60%. The left ventricle has normal function. The left ventricle has no regional wall motion abnormalities. The left ventricular internal cavity size was mildly dilated. Indeterminate diastolic filling due to E-A fusion.  2. Right ventricular systolic function was not well visualized. The right ventricular size is not well visualized.  3. The mitral valve is normal in structure. No evidence of mitral valve regurgitation.  4. The aortic valve is normal in structure. Aortic valve regurgitation is not visualized. No aortic stenosis is present. Conclusion(s)/Recommendation(s): TECHNICALLY DIFFICULT STUDY. FINDINGS  Left Ventricle: Left ventricular ejection fraction, by estimation, is 55 to 60%. The left ventricle has normal function. The left ventricle has no regional wall motion abnormalities. The left ventricular internal cavity size was mildly dilated. There is  no left ventricular hypertrophy. Indeterminate diastolic filling due to E-A fusion. Right Ventricle: The right ventricular size is not well visualized. Right ventricular systolic function was not well visualized. Left Atrium: Left atrial size was normal in size. Right Atrium: Right atrial size was not well visualized. Pericardium: There is no evidence of pericardial effusion. Mitral Valve: The mitral valve is normal in structure. No evidence of mitral valve regurgitation. Tricuspid Valve: The tricuspid valve is not well visualized. Aortic Valve: The aortic valve is normal in structure. Aortic valve regurgitation is not visualized. No aortic stenosis is present. Aortic valve peak gradient measures 10.6 mmHg. Pulmonic Valve: The pulmonic  valve was not well visualized.  Pulmonic valve regurgitation is not visualized. No evidence of pulmonic stenosis. Aorta: The aortic root and ascending aorta are structurally normal, with no evidence of dilitation. Venous: The inferior vena cava was not well visualized. IAS/Shunts: The interatrial septum was not well visualized.  LEFT VENTRICLE PLAX 2D LVIDd:         5.22 cm   Diastology LVIDs:         3.72 cm   LV e' medial:    6.20 cm/s LV PW:         1.01 cm   LV E/e' medial:  12.7 LV IVS:        1.06 cm   LV e' lateral:   9.90 cm/s LVOT diam:     2.00 cm   LV E/e' lateral: 8.0 LVOT Area:     3.14 cm  RIGHT VENTRICLE RV Mid diam:    3.29 cm RV S prime:     16.80 cm/s TAPSE (M-mode): 2.1 cm LEFT ATRIUM             Index LA diam:        3.90 cm 1.72 cm/m LA Vol (A2C):   42.9 ml 18.97 ml/m LA Vol (A4C):   39.5 ml 17.47 ml/m LA Biplane Vol: 41.3 ml 18.26 ml/m  AORTIC VALVE                 PULMONIC VALVE AV Area (Vmax): 2.52 cm     PV Vmax:        1.24 m/s AV Vmax:        163.00 cm/s  PV Peak grad:   6.2 mmHg AV Peak Grad:   10.6 mmHg    RVOT Peak grad: 4 mmHg LVOT Vmax:      131.00 cm/s  AORTA Ao Root diam: 2.60 cm Ao Asc diam:  2.90 cm MITRAL VALVE MV Area (PHT): 20.50 cm   SHUNTS MV Decel Time: 37 msec     Systemic Diam: 2.00 cm MV E velocity: 79.00 cm/s MV A Prime:    11.6 cm/s Sena Slate Electronically signed by Sena Slate Signature Date/Time: 12/30/2021/1:05:21 PM    Final         Scheduled Meds:   stroke: mapping our early stages of recovery book   Does not apply Once   apixaban  5 mg Oral BID   ARIPiprazole  2 mg Oral Daily   atorvastatin  40 mg Oral Daily   gabapentin  300 mg Oral BID   insulin aspart  0-15 Units Subcutaneous TID WC   insulin aspart  0-5 Units Subcutaneous QHS   insulin glargine-yfgn  25 Units Subcutaneous QHS   lisinopril  20 mg Oral Daily   loratadine  10 mg Oral Daily   metoprolol succinate  25 mg Oral Daily   pantoprazole  40 mg Oral Q breakfast   sertraline  37.5 mg Oral Daily   Continuous  Infusions:  sodium chloride 100 mL/hr at 12/31/21 0347     LOS: 1 day    Time spent: 45 min    Silvano Bilis, MD [facility]   If 7PM-7AM, please contact night-coverage [email or web address] Password [email or web address] 12/31/2021, 1:15 PM

## 2022-01-01 LAB — SEDIMENTATION RATE: Sed Rate: 128 mm/hr — ABNORMAL HIGH (ref 0–30)

## 2022-01-01 LAB — CYCLIC CITRUL PEPTIDE ANTIBODY, IGG/IGA: CCP Antibodies IgG/IgA: 3 units (ref 0–19)

## 2022-01-01 LAB — BASIC METABOLIC PANEL
Anion gap: 9 (ref 5–15)
BUN: 25 mg/dL — ABNORMAL HIGH (ref 6–20)
CO2: 25 mmol/L (ref 22–32)
Calcium: 9.4 mg/dL (ref 8.9–10.3)
Chloride: 103 mmol/L (ref 98–111)
Creatinine, Ser: 1.48 mg/dL — ABNORMAL HIGH (ref 0.44–1.00)
GFR, Estimated: 42 mL/min — ABNORMAL LOW (ref >60)
Glucose, Bld: 134 mg/dL — ABNORMAL HIGH (ref 70–99)
Potassium: 4.2 mmol/L (ref 3.5–5.1)
Sodium: 137 mmol/L (ref 135–145)

## 2022-01-01 LAB — ANA W/REFLEX IF POSITIVE: Anti Nuclear Antibody (ANA): NEGATIVE

## 2022-01-01 LAB — GLUCOSE, CAPILLARY
Glucose-Capillary: 124 mg/dL — ABNORMAL HIGH (ref 70–99)
Glucose-Capillary: 128 mg/dL — ABNORMAL HIGH (ref 70–99)
Glucose-Capillary: 134 mg/dL — ABNORMAL HIGH (ref 70–99)
Glucose-Capillary: 137 mg/dL — ABNORMAL HIGH (ref 70–99)
Glucose-Capillary: 140 mg/dL — ABNORMAL HIGH (ref 70–99)
Glucose-Capillary: 157 mg/dL — ABNORMAL HIGH (ref 70–99)
Glucose-Capillary: 169 mg/dL — ABNORMAL HIGH (ref 70–99)

## 2022-01-01 LAB — RHEUMATOID FACTOR: Rheumatoid fact SerPl-aCnc: 20.5 IU/mL — ABNORMAL HIGH (ref <14.0)

## 2022-01-01 LAB — C-REACTIVE PROTEIN: CRP: 22 mg/dL — ABNORMAL HIGH (ref <1.0)

## 2022-01-01 LAB — PROCALCITONIN: Procalcitonin: <0.1 ng/mL

## 2022-01-01 MED ORDER — SODIUM CHLORIDE 0.9 % IV SOLN
INTRAVENOUS | Status: AC
Start: 2022-01-01 — End: 2022-01-01

## 2022-01-01 MED ORDER — PREDNISOLONE SODIUM PHOSPHATE 15 MG/5ML PO SOLN
15.0000 mg | Freq: Every day | ORAL | Status: DC
  Administered 2022-01-01 – 2022-01-02 (×2): 15 mg via ORAL
  Filled 2022-01-01 (×3): qty 5

## 2022-01-01 MED ORDER — HYDROCODONE-ACETAMINOPHEN 5-325 MG PO TABS
1.0000 | ORAL_TABLET | ORAL | Status: DC | PRN
  Administered 2022-01-02: 2 via ORAL
  Filled 2022-01-01 (×2): qty 2

## 2022-01-01 MED ORDER — METHOCARBAMOL 500 MG PO TABS
500.0000 mg | ORAL_TABLET | Freq: Three times a day (TID) | ORAL | Status: DC
  Administered 2022-01-01 – 2022-01-03 (×6): 500 mg via ORAL
  Filled 2022-01-01 (×6): qty 1

## 2022-01-01 MED ORDER — SODIUM CHLORIDE 0.9 % IV SOLN
300.0000 mg | Freq: Once | INTRAVENOUS | Status: AC
  Administered 2022-01-01: 300 mg via INTRAVENOUS
  Filled 2022-01-01: qty 300

## 2022-01-01 NOTE — Progress Notes (Signed)
Physical Therapy Treatment ?Patient Details ?Name: Terry Noble ?MRN: [id number] ?DOB: 19-Aug-1966 ?Today's Date: 01/01/2022 ? ? ?History of Present Illness Terry Noble is a 55 y.o. female with medical history significant for morbid obesity, hypertension, diabetes mellitus, sleep apnea and CHF who presents to the ER for evaluation of a 2-day history of left-sided weakness. She denies having any difficulty swallowing or slurred speech.  She complains of blurred vision but denies having any headache, no dizziness, no lightheadedness, no nausea or vomiting.  She reports that after 2 days of weakness, she rolled out of bed and landed on the floor and could not get up due to the weakness, so called EMS. She denies having any chest pain, no shortness of breath, no changes in her bowel habits, no back pain, no fever, no cough, no chills, no urinary symptoms. Initial CT scan of the head without contrast does not show an acute bleed. ? ?  ?PT Comments  ? ? Pt awake and alert resting in bed upon PT entrance into room today. Pt seen as a co-treat w/ OT this session due to address functional/ADL transfers. Pt reports c/o pain at rest, but does not provide quantitative value. Pt is able to perform bed mobility w/ minA; not necessary but required for trunk elevation and LE transfers. Once seated EOB, Pt is able to scoot forward w/ minA and increased time due to c/o pain and weakness. 2 attempts for sit to stand were made w/ maxAx2 using RW; but were unsuccessful due to Pt's c/o L knee pain and stating "I can't put weight through my L leg" and impulsively sat down in bed. Once back supine in bed, patient was able to perform rolling to the right side w/ minA for pericare needs due to soiled linens. Pt will benefit from continued skilled PT in order to increase LE strength, decrease c/o pain, and improve mobility/gait to restore PLOF. Current discharge recommendation remains appropriate due to the level of assistance required by the  patient to ensure safety and improve overall function. ? ?  ?Recommendations for follow up therapy are one component of a multi-disciplinary discharge planning process, led by the attending physician.  Recommendations may be updated based on patient status, additional functional criteria and insurance authorization. ? ?Follow Up Recommendations ? Skilled nursing-short term rehab (<3 hours/day) ?  ?  ?Assistance Recommended at Discharge Intermittent Supervision/Assistance  ?Patient can return home with the following Two people to help with walking and/or transfers;Two people to help with bathing/dressing/bathroom;Assistance with cooking/housework;Assist for transportation;Help with stairs or ramp for entrance ?  ?Equipment Recommendations ? None recommended by PT  ?  ?Recommendations for Other Services   ? ? ?  ?Precautions / Restrictions Precautions ?Precautions: Fall ?Restrictions ?Weight Bearing Restrictions: No  ?  ? ?Mobility ? Bed Mobility ?Overal bed mobility: Needs Assistance ?Bed Mobility: Rolling, Supine to Sit, Sit to Supine ?Rolling: Min assist ?  ?Supine to sit: Min assist ?Sit to supine: Min assist ?  ?General bed mobility comments: minA for trunk elevation and LE transfers ?  ? ?Transfers ?Overall transfer level: Needs assistance ?Equipment used: Rolling walker (2 wheels), 2 person hand held assist ?Transfers: Sit to/from Stand ?Sit to Stand: Max assist, +2 physical assistance, From elevated surface ?  ?  ?  ?  ?  ?  ?  ? ?Ambulation/Gait ?  ?  ?  ?  ?  ?  ?  ?  ? ? ?Stairs ?  ?  ?  ?  ?  ? ? ?  Wheelchair Mobility ?  ? ?Modified Rankin (Stroke Patients Only) ?  ? ? ?  ?Balance Overall balance assessment: Needs assistance ?Sitting-balance support: Feet supported, Bilateral upper extremity supported, Single extremity supported ?Sitting balance-Leahy Scale: Fair ?  ?  ?  ?Standing balance-Leahy Scale: Zero ?  ?  ?  ?  ?  ?  ?  ?  ?  ?  ?  ?  ?  ? ?  ?Cognition Arousal/Alertness: Awake/alert ?Behavior  During Therapy: [facility] for tasks assessed/performed, Anxious ?Overall Cognitive Status: Within Functional Limits for tasks assessed ?  ?  ?  ?  ?  ?  ?  ?  ?  ?  ?  ?  ?  ?  ?  ?  ?  ?  ?  ? ?  ?Exercises General Exercises - Lower Extremity ?Heel Slides: AAROM, Both, 10 reps ?Hip ABduction/ADduction: AAROM, Both, 10 reps ?Straight Leg Raises: AAROM, Both, 10 reps ? ?  ?General Comments   ?  ?  ? ?Pertinent Vitals/Pain Pain Assessment ?Pain Assessment: Faces ?Faces Pain Scale: Hurts even more ?Pain Location: bilateral UE and LE ?Pain Descriptors / Indicators: Grimacing, Guarding, Pounding, Moaning ?Pain Intervention(s): Limited activity within patient's tolerance, Monitored during session  ? ? ?Home Living   ?  ?  ?  ?  ?  ?  ?  ?  ?  ?   ?  ?Prior Function    ?  ?  ?   ? ?PT Goals (current goals can now be found in the care plan section) Progress towards PT goals: Progressing toward goals ? ?  ?Frequency ? ? ? Min 2X/week ? ? ? ?  ?PT Plan Current plan remains appropriate  ? ? ?Co-evaluation PT/OT/SLP Co-Evaluation/Treatment: Yes ?Reason for Co-Treatment: Complexity of the patient's impairments (multi-system involvement);To address functional/ADL transfers;For patient/therapist safety ?PT goals addressed during session: Mobility/safety with mobility;Strengthening/ROM;Balance ?OT goals addressed during session: ADL's and self-care;Proper use of Adaptive equipment and DME ?  ? ?  ?AM-PAC PT "6 Clicks" Mobility   ?Outcome Measure ? Help needed turning from your back to your side while in a flat bed without using bedrails?: A Little ?Help needed moving from lying on your back to sitting on the side of a flat bed without using bedrails?: A Lot ?Help needed moving to and from a bed to a chair (including a wheelchair)?: A Lot ?Help needed standing up from a chair using your arms (e.g., wheelchair or bedside chair)?: Total ?Help needed to walk in hospital room?: Total ?Help needed climbing 3-5 steps with a railing? : Total ?6  Click Score: 10 ? ?  ?End of Session   ?Activity Tolerance: Patient limited by pain ?Patient left: in bed;with call bell/phone within reach;with bed alarm set ?Nurse Communication: Mobility status ?PT Visit Diagnosis: History of falling (Z91.81);Other abnormalities of gait and mobility (R26.89);Unsteadiness on feet (R26.81);Pain;Muscle weakness (generalized) (M62.81) ?Pain - Right/Left: Left ?Pain - part of body: Shoulder;Arm;Hand;Knee;Ankle and joints of foot ?  ? ? ?Time: [id number] ?PT Time Calculation (min) (ACUTE ONLY): 40 min ? ?Charges:             ?          ? ? ?Jonnie Kind, SPT ?01/01/2022, 4:16 PM ? ?

## 2022-01-01 NOTE — Progress Notes (Signed)
PT Cancellation Note ? ?Patient Details ?Name: Terry Noble  ?MRN: [id number] ?DOB: 09/03/1966 ? ? ?Cancelled Treatment:    Reason Eval/Treat Not Completed: Pain limiting ability to participate PT attempt, family at bedside, Pt became visible emotional/upset due significant level of pain when PT attempted for today's session. Will attempt at later date/time and coordinate w/ RN for Pt's pain level. ? ? ?Jeralyn BennettAndrew Julienne Vogler, SPT ?01/01/2022, 11:12 AM ?

## 2022-01-01 NOTE — Progress Notes (Signed)
PROGRESS NOTE    Terry Noble  ZOX:[id number] DOB: 10/20/1966 DOA: 12/29/2021 PCP: Oswaldo Conroy, MD    Brief Narrative:  56 y.o. female with medical history significant for morbid obesity, hypertension, diabetes mellitus, sleep apnea and CHF who presents to the ER for evaluation of a 2-day history of left-sided weakness. Patient notes that she has had numbness and tingling in her left upper and lower extremity since Friday and thought it was an allergic reaction.  She took her EpiPen and Benadryl without any improvement and so she called EMS. She denies having any difficulty swallowing or slurred speech.  She complains of blurred vision but denies having any headache, no dizziness, no lightheadedness, no nausea or vomiting.  She states that she rolled out of bed this morning and landed on the floor and could not get up due to the weakness.  She also voided on herself because she was too weak to get up. She denies having any chest pain, no shortness of breath, no changes in her bowel habits, no back pain, no fever, no cough, no chills, no urinary symptoms. Initial CT scan of the head without contrast does not show an acute bleed.   Not noted at time of admission but patient was recently admitted [facility], discharged to [facility], recently returned home. Admission was for uti/cellulitis with sepsis, required pressors, also had hhs, also new dvt and aki.   Assessment & Plan:   Principal Problem:   Weakness Active Problems:   CHF (congestive heart failure) (HCC)   Diabetes (HCC)   HTN (hypertension)   Obstructive sleep apnea   Morbid obesity (HCC)   Depression   CKD (chronic kidney disease) stage 3, GFR 30-59 ml/min (HCC)   SIRS (systemic inflammatory response syndrome) (HCC)  # Weakness Patient presents with left sided weakness with initial concern for stroke. Review of previous admission note shows this to be chronic. Ct and mri are negative, this does not appear to be stroke - PT/OT  following, advising SNF - TOC searching for bed   # SIRS # Bacteriuria Recent admission for cellulitis and uti sepsis, treated. Here febrile, tachycardic. Last fever AM 3/6. no signs cellulitis. Doesn't endorse uti symptoms but urine is growing GNRs, had e coli at [facility] in January that was pan sensitive. No blood in urine to suggest stone, no abd pain. No diarrhea. Some concern for septic joint but has diffuse joint pains, x-rays showing only chronic OA changes, today better able to move them, and ortho thinks septic arthritis less likely.  No respiratory symptoms, cxr clear, covid/flu neg. No AMS or neck pain/stiffness to suggest meningitis. Possible rheumatologic problem? 1/4 blood cultures growing staph epi, a likely contaminant. No thrombus on TTE. Uric acid is mildly elevated. Inflammatory markers are elevated -Repeat set of blood cultures negative -Urine culture with Proteus mirabilis and Klebsiella pneumonia, pansensitive Plan: Continue IVF Continue cefazolin Follow-up rheumatologic work-up   # DVT Recent diagnosis, on eliquis - cont eliquis   # MDD - home abilify   # Neuropathy - home gabapentin   # T2DM Recent admit for hhs. Here glucose wnl - semglee basal - SSI   # CKD 3b Creatinine at or around baseline of 1.4 IV fluids for next 10 hours   #Iron deficiency anemia Hgb 8s is at baseline -IV Venofer x1 on 3/8   # HFpEF Appears euvolemic. TTE stable   # HTN Here mild elevation - homne lisinopril - resume home chlorthalidone   # Neuropathy -  home gabapentin   # OSA - cpap qhs  #Stage III obesity BMI 44 This complicates overall care and prognosis   DVT prophylaxis: Eliquis Code Status: Full Family Communication: None today Disposition Plan: Status is: Inpatient Remains inpatient appropriate because: Intractable pain of unclear etiology, decreased ability to ambulate   Level of care: Med-Surg  Consultants:  None  Procedures:   None  Antimicrobials: Ancef   Subjective: Seen and examined.  Reports weakness and pain and decreased range of motion  Objective: Vitals:   12/31/21 2231 01/01/22 0234 01/01/22 0746 01/01/22 1018  BP: 137/69 139/68 132/79 (!) 159/70  Pulse: (!) 113 (!) 112 (!) 112 (!) 110  Resp: 20 16 18 18   Temp: 98.3 F (36.8 C) 98.4 F (36.9 C) 98.1 F (36.7 C) 99.3 F (37.4 C)  TempSrc:   Oral Oral  SpO2: 95% 93% 100% 97%  Weight:      Height:        Intake/Output Summary (Last 24 hours) at 01/01/2022 1254 Last data filed at 01/01/2022 1000 Gross per 24 hour  Intake 630 ml  Output 1250 ml  Net -620 ml   Filed Weights   12/29/21 0837 12/29/21 2041  Weight: 124.3 kg 120.3 kg    Examination:  General exam: Appears calm and comfortable  Respiratory system: Clear to auscultation. Respiratory effort normal. Cardiovascular system: S1-S2, RRR, no murmurs, no pedal edema Gastrointestinal system: Obese, NT/ND, normal bowel sounds Central nervous system: Alert and oriented.  No focal deficits Extremities: Symmetrical decreased power bilaterally.  Worse on left Skin: No rashes, lesions or ulcers Psychiatry: Judgement and insight appear normal. Mood & affect appropriate.     Data Reviewed: I have personally reviewed following labs and imaging studies  CBC: Recent Labs  Lab 12/29/21 0843 12/31/21 0423  WBC 14.8* 15.3*  NEUTROABS 10.2*  --   HGB 8.6* 8.5*  HCT 29.0* 28.3*  MCV 87.6 85.2  PLT 383 407*   Basic Metabolic Panel: Recent Labs  Lab 12/29/21 0843 12/31/21 0423 01/01/22 0451  NA 138 138 137  K 4.2 4.2 4.2  CL 102 102 103  CO2 23 27 25   GLUCOSE 176* 128* 134*  BUN 22* 21* 25*  CREATININE 1.41* 1.41* 1.48*  CALCIUM 9.0 9.2 9.4   GFR: Estimated Creatinine Clearance: 56.5 mL/min (A) (by C-G formula based on SCr of 1.48 mg/dL (H)). Liver Function Tests: Recent Labs  Lab 12/29/21 0843  AST 18  ALT 13  ALKPHOS 68  BILITOT 0.8  PROT 7.9  ALBUMIN 3.5   No  results for input(s): LIPASE, AMYLASE in the last 168 hours. No results for input(s): AMMONIA in the last 168 hours. Coagulation Profile: Recent Labs  Lab 12/30/21 1046  INR 1.2   Cardiac Enzymes: Recent Labs  Lab 12/29/21 0843  CKTOTAL 51   BNP (last 3 results) No results for input(s): PROBNP in the last 8760 hours. HbA1C: Recent Labs    12/30/21 0606  HGBA1C 11.8*   CBG: Recent Labs  Lab 12/31/21 2006 01/01/22 0005 01/01/22 0407 01/01/22 0748 01/01/22 1137  GLUCAP 143* 137* 124* 140* 128*   Lipid Profile: Recent Labs    12/30/21 0606  CHOL 136  HDL 34*  LDLCALC 86  TRIG 78  CHOLHDL 4.0   Thyroid Function Tests: No results for input(s): TSH, T4TOTAL, FREET4, T3FREE, THYROIDAB in the last 72 hours. Anemia Panel: Recent Labs    12/30/21 1046  FERRITIN 211  TIBC 281  IRON 20*   Sepsis  Labs: Recent Labs  Lab 12/29/21 1037  LATICACIDVEN 1.5    Recent Results (from the past 240 hour(s))  Resp Panel by RT-PCR (Flu A&B, Covid) Nasopharyngeal Swab     Status: None   Collection Time: 12/29/21  8:43 AM   Specimen: Nasopharyngeal Swab; Nasopharyngeal(NP) swabs in vial transport medium  Result Value Ref Range Status   SARS Coronavirus 2 by RT PCR NEGATIVE NEGATIVE Final    Comment: (NOTE) SARS-CoV-2 target nucleic acids are NOT DETECTED.  The SARS-CoV-2 RNA is generally detectable in upper respiratory specimens during the acute phase of infection. The lowest concentration of SARS-CoV-2 viral copies this assay can detect is 138 copies/mL. A negative result does not preclude SARS-Cov-2 infection and should not be used as the sole basis for treatment or other patient management decisions. A negative result may occur with  improper specimen collection/handling, submission of specimen other than nasopharyngeal swab, presence of viral mutation(s) within the areas targeted by this assay, and inadequate number of viral copies(<138 copies/mL). A negative result  must be combined with clinical observations, patient history, and epidemiological information. The expected result is Negative.  Fact Sheet for Patients:  BloggerCourse.com  Fact Sheet for Healthcare Providers:  SeriousBroker.it  This test is no t yet approved or cleared by the [place] FDA and  has been authorized for detection and/or diagnosis of SARS-CoV-2 by FDA under an Emergency Use Authorization (EUA). This EUA will remain  in effect (meaning this test can be used) for the duration of the COVID-19 declaration under Section 564(b)(1) of the Act, 21 U.S.C.section 360bbb-3(b)(1), unless the authorization is terminated  or revoked sooner.       Influenza A by PCR NEGATIVE NEGATIVE Final   Influenza B by PCR NEGATIVE NEGATIVE Final    Comment: (NOTE) The Xpert Xpress SARS-CoV-2/FLU/RSV plus assay is intended as an aid in the diagnosis of influenza from Nasopharyngeal swab specimens and should not be used as a sole basis for treatment. Nasal washings and aspirates are unacceptable for Xpert Xpress SARS-CoV-2/FLU/RSV testing.  Fact Sheet for Patients: BloggerCourse.com  Fact Sheet for Healthcare Providers: SeriousBroker.it  This test is not yet approved or cleared by the [place] FDA and has been authorized for detection and/or diagnosis of SARS-CoV-2 by FDA under an Emergency Use Authorization (EUA). This EUA will remain in effect (meaning this test can be used) for the duration of the COVID-19 declaration under Section 564(b)(1) of the Act, 21 U.S.C. section 360bbb-3(b)(1), unless the authorization is terminated or revoked.  Performed at [facility], [address] [place], [place] [address]   Urine Culture     Status: Abnormal (Preliminary result)   Collection Time: 12/29/21  9:40 AM   Specimen: Urine, Clean Catch  Result Value Ref Range Status    Specimen Description   Final    URINE, CLEAN CATCH Performed at [facility], [address], [place], [place] [address]    Special Requests   Final    NONE Performed at [facility], [address] [place], [place] [address]    Culture (A)  Final    >=100,000 COLONIES/mL PROTEUS MIRABILIS 60,000 COLONIES/mL KLEBSIELLA PNEUMONIAE SUSCEPTIBILITIES TO FOLLOW Performed at [facility], [address] [place], [place] [address]    Report Status PENDING  Incomplete   Organism ID, Bacteria PROTEUS MIRABILIS (A)  Final      Susceptibility   Proteus mirabilis - MIC*    AMPICILLIN 4 SENSITIVE Sensitive  CEFAZOLIN 8 SENSITIVE Sensitive     CEFEPIME <=0.12 SENSITIVE Sensitive     CEFTRIAXONE <=0.25 SENSITIVE Sensitive     CIPROFLOXACIN <=0.25 SENSITIVE Sensitive     GENTAMICIN <=1 SENSITIVE Sensitive     IMIPENEM 2 SENSITIVE Sensitive     NITROFURANTOIN 128 RESISTANT Resistant     TRIMETH/SULFA <=20 SENSITIVE Sensitive     AMPICILLIN/SULBACTAM <=2 SENSITIVE Sensitive     PIP/TAZO <=4 SENSITIVE Sensitive     * >=100,000 COLONIES/mL PROTEUS MIRABILIS  CULTURE, BLOOD (ROUTINE X 2) w Reflex to ID Panel     Status: Abnormal (Preliminary result)   Collection Time: 12/30/21 10:34 AM   Specimen: BLOOD  Result Value Ref Range Status   Specimen Description   Final    BLOOD RIGHT [facility] Performed at [facility], [address] [place], [place] [address]    Special Requests   Final    BOTTLES DRAWN AEROBIC AND ANAEROBIC Blood Culture adequate volume Performed at [facility], [address] [place], [place] [address]    Culture  Setup Time   Final    GRAM POSITIVE COCCI AEROBIC BOTTLE ONLY CRITICAL RESULT CALLED TO, READ BACK BY AND VERIFIED WITH: [facility]LL 12/31/21 1142 MW    Culture (A)  Final    STAPHYLOCOCCUS EPIDERMIDIS THE SIGNIFICANCE OF ISOLATING THIS ORGANISM FROM A SINGLE SET OF BLOOD CULTURES WHEN MULTIPLE SETS ARE DRAWN IS  UNCERTAIN. PLEASE NOTIFY THE MICROBIOLOGY DEPARTMENT WITHIN ONE WEEK IF SPECIATION AND SENSITIVITIES ARE REQUIRED. Performed at [facility], [address] [place], [place] [address]    Report Status PENDING  Incomplete  Blood Culture ID Panel (Reflexed)     Status: Abnormal   Collection Time: 12/30/21 10:34 AM  Result Value Ref Range Status   Enterococcus faecalis NOT DETECTED NOT DETECTED Final   Enterococcus Faecium NOT DETECTED NOT DETECTED Final   Listeria monocytogenes NOT DETECTED NOT DETECTED Final   Staphylococcus species DETECTED (A) NOT DETECTED Final    Comment: CRITICAL RESULT CALLED TO, READ BACK BY AND VERIFIED WITH: DEVAN MITCHELL 12/31/21 1143 MW    Staphylococcus aureus (BCID) NOT DETECTED NOT DETECTED Final   Staphylococcus epidermidis DETECTED (A) NOT DETECTED Final    Comment: CRITICAL RESULT CALLED TO, READ BACK BY AND VERIFIED WITH: DEVAN MITCHELL 12/31/21 1143 MW    Staphylococcus lugdunensis NOT DETECTED NOT DETECTED Final   Streptococcus species NOT DETECTED NOT DETECTED Final   Streptococcus agalactiae NOT DETECTED NOT DETECTED Final   Streptococcus pneumoniae NOT DETECTED NOT DETECTED Final   Streptococcus pyogenes NOT DETECTED NOT DETECTED Final   A.calcoaceticus-baumannii NOT DETECTED NOT DETECTED Final   Bacteroides fragilis NOT DETECTED NOT DETECTED Final   Enterobacterales NOT DETECTED NOT DETECTED Final   Enterobacter cloacae complex NOT DETECTED NOT DETECTED Final   Escherichia coli NOT DETECTED NOT DETECTED Final   Klebsiella aerogenes NOT DETECTED NOT DETECTED Final   Klebsiella oxytoca NOT DETECTED NOT DETECTED Final   Klebsiella pneumoniae NOT DETECTED NOT DETECTED Final   Proteus species NOT DETECTED NOT DETECTED Final   Salmonella species NOT DETECTED NOT DETECTED Final   Serratia marcescens NOT DETECTED NOT DETECTED Final   Haemophilus influenzae NOT DETECTED NOT DETECTED Final   Neisseria meningitidis NOT DETECTED NOT DETECTED Final    Pseudomonas aeruginosa NOT DETECTED NOT DETECTED Final   Stenotrophomonas maltophilia NOT DETECTED NOT DETECTED Final   Candida albicans NOT DETECTED NOT DETECTED Final   Candida auris NOT DETECTED NOT DETECTED Final  Candida glabrata NOT DETECTED NOT DETECTED Final   Candida krusei NOT DETECTED NOT DETECTED Final   Candida parapsilosis NOT DETECTED NOT DETECTED Final   Candida tropicalis NOT DETECTED NOT DETECTED Final   Cryptococcus neoformans/gattii NOT DETECTED NOT DETECTED Final   Methicillin resistance mecA/C NOT DETECTED NOT DETECTED Final    Comment: Performed at [facility], [address] [place], [place] [address]  CULTURE, BLOOD (ROUTINE X 2) w Reflex to ID Panel     Status: None (Preliminary result)   Collection Time: 12/30/21 10:41 AM   Specimen: BLOOD  Result Value Ref Range Status   Specimen Description BLOOD LEFT AC  Final   Special Requests   Final    BOTTLES DRAWN AEROBIC AND ANAEROBIC Blood Culture adequate volume   Culture   Final    NO GROWTH 2 DAYS Performed at [facility], [address], [place], [place] [address]    Report Status PENDING  Incomplete  CULTURE, BLOOD (ROUTINE X 2) w Reflex to ID Panel     Status: None (Preliminary result)   Collection Time: 12/31/21  1:25 PM   Specimen: BLOOD  Result Value Ref Range Status   Specimen Description BLOOD LEFT AC  Final   Special Requests   Final    BOTTLES DRAWN AEROBIC ONLY Blood Culture results may not be optimal due to an inadequate volume of blood received in culture bottles   Culture   Final    NO GROWTH < 24 HOURS Performed at [facility], [address] [place], [place] [address]    Report Status PENDING  Incomplete  CULTURE, BLOOD (ROUTINE X 2) w Reflex to ID Panel     Status: None (Preliminary result)   Collection Time: 12/31/21  1:26 PM   Specimen: BLOOD  Result Value Ref Range Status   Specimen Description BLOOD RIGHT AC  Final   Special Requests   Final     BOTTLES DRAWN AEROBIC AND ANAEROBIC Blood Culture adequate volume   Culture   Final    NO GROWTH < 24 HOURS Performed at [facility], [address], [place], [place] [address]    Report Status PENDING  Incomplete  Chlamydia/NGC rt PCR ([facility] only)     Status: None   Collection Time: 12/31/21  3:43 PM   Specimen: Urine  Result Value Ref Range Status   Specimen source GC/Chlam ENDOCERVICAL  Final   Chlamydia Tr NOT DETECTED NOT DETECTED Final   N gonorrhoeae NOT DETECTED NOT DETECTED Final    Comment: (NOTE) This CT/NG assay has not been evaluated in patients with a history of  hysterectomy. Performed at [facility], [address], [place], [place] [address]          Radiology Studies: No results found.      Scheduled Meds:  apixaban  5 mg Oral BID   ARIPiprazole  2 mg Oral Daily   atorvastatin  40 mg Oral Daily   chlorthalidone  25 mg Oral Daily   gabapentin  300 mg Oral BID   insulin aspart  0-15 Units Subcutaneous TID WC   insulin aspart  0-5 Units Subcutaneous QHS   insulin glargine-yfgn  25 Units Subcutaneous QHS   lisinopril  20 mg Oral Daily   loratadine  10 mg Oral Daily   methocarbamol  500 mg Oral TID   metoprolol succinate  25 mg Oral Daily   pantoprazole  40 mg Oral Q breakfast   sertraline  37.5 mg Oral Daily   Continuous Infusions:  sodium chloride 100 mL/hr at 01/01/22 0953    ceFAZolin (ANCEF) IV 1 g (01/01/22 0853)     LOS: 2 days      Tresa Moore, MD [facility]   If 7PM-7AM, please contact night-coverage  01/01/2022, 12:54 PM

## 2022-01-01 NOTE — Progress Notes (Signed)
Occupational Therapy Treatment ?Patient Details ?Name: Blenda PealsSherma Riojas ?MRN: [id number] ?DOB: 07/17/1966 ?Today's Date: 01/01/2022 ? ? ?History of present illness Blenda PealsSherma Shughart is a 56 y.o. female with medical history significant for morbid obesity, hypertension, diabetes mellitus, sleep apnea and CHF who presents to the ER for evaluation of a 2-day history of left-sided weakness. She denies having any difficulty swallowing or slurred speech.  She complains of blurred vision but denies having any headache, no dizziness, no lightheadedness, no nausea or vomiting.  She reports that after 2 days of weakness, she rolled out of bed and landed on the floor and could not get up due to the weakness, so called EMS. She denies having any chest pain, no shortness of breath, no changes in her bowel habits, no back pain, no fever, no cough, no chills, no urinary symptoms. Initial CT scan of the head without contrast does not show an acute bleed. ?  ?OT comments ? Pt seen for OT/PT co-treatment on this date. Upon arrival to room, pt awake and seated upright in bed. This date, pt required significant encouragement to attempt OOB mobility/self-care tasks. Pt currently requires MIN A for bed mobility and MIN A for seated UB dressing at EOB. Pt attempted x2 bouts of sit>stand transfer, however was unable to clear hips from bed following MAX A of 2 people. Following second sit>stand attempt, pt was observed to have BM smear on chuck. Pt returned to supine and required MAX A for bed-level peri-care. Pt currently presents with pain, decreased strength, and decreased activity tolerance, and continues to benefit from skilled OT services to maximize return to PLOF and minimize risk of future falls, injury, caregiver burden, and readmission. Will continue to follow POC. Upon discharge, recommend SNF   ? ?Recommendations for follow up therapy are one component of a multi-disciplinary discharge planning process, led by the attending physician.   Recommendations may be updated based on patient status, additional functional criteria and insurance authorization. ?   ?Follow Up Recommendations ? Skilled nursing-short term rehab (<3 hours/day)  ?  ?Assistance Recommended at Discharge Frequent or constant Supervision/Assistance  ?Patient can return home with the following ? A lot of help with bathing/dressing/bathroom;Assistance with cooking/housework;Assist for transportation;Help with stairs or ramp for entrance;Two people to help with walking and/or transfers ?  ?Equipment Recommendations ? Other (comment) (defer to next venue of care)  ?  ?   ?Precautions / Restrictions Precautions ?Precautions: Fall ?Restrictions ?Weight Bearing Restrictions: No  ? ? ?  ? ?Mobility Bed Mobility ?Overal bed mobility: Needs Assistance ?Bed Mobility: Rolling, Supine to Sit, Sit to Supine ?Rolling: Min assist ?  ?Supine to sit: Min assist ?Sit to supine: Min assist ?  ?General bed mobility comments: minA for trunk elevation and LE transfers ?  ? ?Transfers ?Overall transfer level: Needs assistance ?Equipment used: Rolling walker (2 wheels), 2 person hand held assist ?Transfers: Sit to/from Stand ?Sit to Stand: Max assist, +2 physical assistance, From elevated surface ?  ?  ?  ?  ?  ?General transfer comment: unable to clear hips from bed following x2 bouts ?  ?  ?Balance Overall balance assessment: Needs assistance ?Sitting-balance support: Feet supported, Single extremity supported ?Sitting balance-Leahy Scale: Fair ?Sitting balance - Comments: Requires supervision for static sitting at EOB ?  ?  ?Standing balance-Leahy Scale: Zero ?  ?  ?  ?  ?  ?  ?  ?  ?  ?  ?  ?  ?   ? ?ADL either  performed or assessed with clinical judgement  ? ?ADL Overall ADL's : Needs assistance/impaired ?  ?  ?  ?  ?  ?  ?  ?  ?Upper Body Dressing : Minimal assistance;Sitting ?Upper Body Dressing Details (indicate cue type and reason): to don hospital gown while seated EOB ?  ?  ?  ?  ?Toileting-  Clothing Manipulation and Hygiene: Maximal assistance;Bed level ?Toileting - Clothing Manipulation Details (indicate cue type and reason): to perform posterior peri-care ?  ?  ?  ?  ?  ? ? ? ?Cognition Arousal/Alertness: Awake/alert ?Behavior During Therapy: [facility] for tasks assessed/performed, Anxious ?Overall Cognitive Status: Within Functional Limits for tasks assessed ?  ?  ?  ?  ?  ?  ?  ?  ?  ?  ?  ?  ?  ?  ?  ?  ?  ?  ?  ?   ?   ?   ?   ? ? ?Pertinent Vitals/ Pain       Pain Assessment ?Pain Assessment: Faces ?Faces Pain Scale: Hurts even more ?Pain Location: bilateral UE and LE ?Pain Descriptors / Indicators: Grimacing, Guarding, Pounding, Moaning ?Pain Intervention(s): Limited activity within patient's tolerance, Monitored during session, Repositioned ? ?   ?   ? ?Frequency ? Min 2X/week  ? ? ? ? ?  ?Progress Toward Goals ? ?OT Goals(current goals can now be found in the care plan section) ? Progress towards OT goals: Progressing toward goals ? ?Acute Rehab OT Goals ?Patient Stated Goal: to have less pain ?OT Goal Formulation: With patient ?Time For Goal Achievement: 01/13/22 ?Potential to Achieve Goals: Good  ?Plan Discharge plan remains appropriate;Frequency remains appropriate   ? ?Co-evaluation ? ? ? PT/OT/SLP Co-Evaluation/Treatment: Yes ?Reason for Co-Treatment: For patient/therapist safety;To address functional/ADL transfers ?PT goals addressed during session: Mobility/safety with mobility;Balance ?OT goals addressed during session: ADL's and self-care ?  ? ?  ?AM-PAC OT "6 Clicks" Daily Activity     ?Outcome Measure ? ? Help from another person eating meals?: A Lot ?Help from another person taking care of personal grooming?: A Lot ?Help from another person toileting, which includes using toliet, bedpan, or urinal?: A Lot ?Help from another person bathing (including washing, rinsing, drying)?: A Lot ?Help from another person to put on and taking off regular upper body clothing?: A Lot ?Help from another  person to put on and taking off regular lower body clothing?: A Lot ?6 Click Score: 12 ? ?  ?End of Session Equipment Utilized During Treatment: Rolling walker (2 wheels) ? ?OT Visit Diagnosis: Muscle weakness (generalized) (M62.81);Pain;Unsteadiness on feet (R26.81) ?Pain - Right/Left: Left ?Pain - part of body: Knee ?  ?Activity Tolerance Patient tolerated treatment well ?  ?Patient Left in bed;with bed alarm set;with call bell/phone within reach ?  ?Nurse Communication Mobility status ?  ? ?   ? ?Time: 1610-9604 ?OT Time Calculation (min): 40 min ? ?Charges: OT General Charges ?$OT Visit: 1 Visit ?OT Treatments ?$Self Care/Home Management : 8-22 mins ? ?Matthew Folks, OTR/L ?ASCOM [phone] ? ?

## 2022-01-01 NOTE — Progress Notes (Addendum)
Patient remains a yellow mews due to tachycardia. Patient not noted to be in any distress and has no complaints of any discomfort or abnormal feeling. MD and charge nurse aware. Continue to take vitals every 4 hours. ?

## 2022-01-01 NOTE — TOC Progression Note (Signed)
Transition of Care (TOC) - Progression Note  ? ? ?Patient Details  ?Name: Blenda PealsSherma  ?MRN: [id number] ?Date of Birth: 03/12/1966 ? ?Transition of Care (TOC) CM/SW Contact  ?Chapman FitchStephanie T Denaly Gatling, RN ?Phone Number: ?01/01/2022, 3:37 PM ? ?Clinical Narrative:    ?Bed offers presented to patient ?Patient declines offers and states that she will be going home with her daughter and sister ?Patient states there is no any DME that she needs at home ?[facility] ?Sister to visit again tomorrow and I will meet with them to confirm discharge plan  ? ? ?Expected Discharge Plan: Skilled Nursing Facility ?Barriers to Discharge: Continued Medical Work up ? ?Expected Discharge Plan and Services ?Expected Discharge Plan: Skilled Nursing Facility ?  ?Discharge Planning Services: CM Consult ?  ?  ?                ?  ?  ?  ?  ?  ?  ?  ?  ?  ?  ? ? ?Social Determinants of Health (SDOH) Interventions ?  ? ?Readmission Risk Interventions ?No flowsheet data found. ? ?

## 2022-01-02 LAB — BASIC METABOLIC PANEL
Anion gap: 12 (ref 5–15)
BUN: 25 mg/dL — ABNORMAL HIGH (ref 6–20)
CO2: 25 mmol/L (ref 22–32)
Calcium: 9.7 mg/dL (ref 8.9–10.3)
Chloride: 102 mmol/L (ref 98–111)
Creatinine, Ser: 1.45 mg/dL — ABNORMAL HIGH (ref 0.44–1.00)
GFR, Estimated: 43 mL/min — ABNORMAL LOW (ref >60)
Glucose, Bld: 177 mg/dL — ABNORMAL HIGH (ref 70–99)
Potassium: 4.5 mmol/L (ref 3.5–5.1)
Sodium: 139 mmol/L (ref 135–145)

## 2022-01-02 LAB — GLUCOSE, CAPILLARY
Glucose-Capillary: 178 mg/dL — ABNORMAL HIGH (ref 70–99)
Glucose-Capillary: 125 mg/dL — ABNORMAL HIGH (ref 70–99)
Glucose-Capillary: 131 mg/dL — ABNORMAL HIGH (ref 70–99)
Glucose-Capillary: 188 mg/dL — ABNORMAL HIGH (ref 70–99)
Glucose-Capillary: 234 mg/dL — ABNORMAL HIGH (ref 70–99)

## 2022-01-02 LAB — CULTURE, BLOOD (ROUTINE X 2): Special Requests: ADEQUATE

## 2022-01-02 LAB — URINE CULTURE: Culture: >=100000 — AB

## 2022-01-02 NOTE — Plan of Care (Signed)

## 2022-01-02 NOTE — Progress Notes (Signed)
PROGRESS NOTE    Terry Noble  ZOX:[id number] DOB: 03-24-1966 DOA: 12/29/2021 PCP: Oswaldo Conroy, MD    Brief Narrative:  56 y.o. female with medical history significant for morbid obesity, hypertension, diabetes mellitus, sleep apnea and CHF who presents to the ER for evaluation of a 2-day history of left-sided weakness. Patient notes that she has had numbness and tingling in her left upper and lower extremity since Friday and thought it was an allergic reaction.  She took her EpiPen and Benadryl without any improvement and so she called EMS. She denies having any difficulty swallowing or slurred speech.  She complains of blurred vision but denies having any headache, no dizziness, no lightheadedness, no nausea or vomiting.  She states that she rolled out of bed this morning and landed on the floor and could not get up due to the weakness.  She also voided on herself because she was too weak to get up. She denies having any chest pain, no shortness of breath, no changes in her bowel habits, no back pain, no fever, no cough, no chills, no urinary symptoms. Initial CT scan of the head without contrast does not show an acute bleed.   Not noted at time of admission but patient was recently [facility], discharged to [facility], recently returned home. Admission was for uti/cellulitis with sepsis, required pressors, also had hhs, also new dvt and aki.   Assessment & Plan:   Principal Problem:   Weakness Active Problems:   CHF (congestive heart failure) (HCC)   Diabetes (HCC)   HTN (hypertension)   Obstructive sleep apnea   Morbid obesity (HCC)   Depression   CKD (chronic kidney disease) stage 3, GFR 30-59 ml/min (HCC)   SIRS (systemic inflammatory response syndrome) (HCC)  # Weakness Patient presents with left sided weakness with initial concern for stroke. Review of previous admission note shows this to be chronic. Ct and mri are negative, this does not appear to be stroke - PT/OT  following, advising SNF - TOC searching for bed -Patient refusing skilled nursing facility.  Will go home with home health services -Unclear nature of patient's weakness.  There is a concern for possible underlying rheumatoid arthritis.  Rheumatoid factor ordered and pending.  In the meantime we will continue with prednisolone 15 mg daily as there does appear to be some improvement in symptoms.   # SIRS # Bacteriuria Recent admission for cellulitis and uti sepsis, treated. Here febrile, tachycardic. Last fever AM 3/6. no signs cellulitis. Doesn't endorse uti symptoms but urine is growing GNRs, had e coli at [facility] in January that was pan sensitive. No blood in urine to suggest stone, no abd pain. No diarrhea. Some concern for septic joint but has diffuse joint pains, x-rays showing only chronic OA changes, today better able to move them, and ortho thinks septic arthritis less likely.  No respiratory symptoms, cxr clear, covid/flu neg. No AMS or neck pain/stiffness to suggest meningitis. Possible rheumatologic problem? 1/4 blood cultures growing staph epi, a likely contaminant. No thrombus on TTE. Uric acid is mildly elevated. Inflammatory markers are elevated -Repeat set of blood cultures negative -Urine culture with Proteus mirabilis and Klebsiella pneumonia, pansensitive -Procalcitonin negative indicating likely contaminant versus chronic bacteria Plan: DC IVF DC antibiotics =   # DVT Recent diagnosis, on eliquis - cont eliquis   # MDD - home abilify   # Neuropathy - home gabapentin   # T2DM Recent admit for hhs. Here glucose wnl - semglee basal -  SSI   # CKD 3b Creatinine at or around baseline of 1.4 IV fluids for next 10 hours   #Iron deficiency anemia Hgb 8s is at baseline -IV Venofer x1 on 3/8   # HFpEF Appears euvolemic. TTE stable   # HTN Here mild elevation - homne lisinopril - resume home chlorthalidone   # Neuropathy - home gabapentin   # OSA - cpap  qhs  #Stage III obesity BMI 44 This complicates overall care and prognosis   DVT prophylaxis: Eliquis Code Status: Full Family Communication: None today Disposition Plan: Status is: Inpatient Remains inpatient appropriate because: Weakness of unclear etiology.  Possible underlying autoimmune arthropathy.  On steroids.  Physical exam improving.  Possible discharge 3/10   Level of care: Med-Surg  Consultants:  None  Procedures:  None  Antimicrobials:   Subjective: Seen and examined.  Reports improvement in weakness and range of motion  Objective: Vitals:   01/01/22 1540 01/01/22 1945 01/02/22 0452 01/02/22 0755  BP: (!) 167/67 126/63 137/66 139/69  Pulse: (!) 116 (!) 114 97 98  Resp: Temp: 98.3 F (36.8 C) 100.1 F (37.8 C) 98.5 F (36.9 C) 98.4 F (36.9 C)  TempSrc: Oral   Oral  SpO2: 99% 94% 95% 100%  Weight:      Height:        Intake/Output Summary (Last 24 hours) at 01/02/2022 1307 Last data filed at 01/02/2022 1026 Gross per 24 hour  Intake 1180 ml  Output 770 ml  Net 410 ml   Filed Weights   12/29/21 0837 12/29/21 2041  Weight: 124.3 kg 120.3 kg    Examination:  General exam: NAD Respiratory system: Clear to auscultation. Respiratory effort normal. Cardiovascular system: S1-S2, RRR, no murmurs, no pedal edema Gastrointestinal system: Obese, NT/ND, normal bowel sounds Central nervous system: Alert and oriented.  No focal deficits Extremities: Intact power bilaterally.  Slightly worse on the left Skin: No rashes, lesions or ulcers Psychiatry: Judgement and insight appear normal. Mood & affect appropriate.     Data Reviewed: I have personally reviewed following labs and imaging studies  CBC: Recent Labs  Lab 12/29/21 0843 12/31/21 0423  WBC 14.8* 15.3*  NEUTROABS 10.2*  --   HGB 8.6* 8.5*  HCT 29.0* 28.3*  MCV 87.6 85.2  PLT 383 407*   Basic Metabolic Panel: Recent Labs  Lab 12/29/21 0843 12/31/21 0423 01/01/22 0451  01/02/22 0451  NA 138 138 137 139  K 4.2 4.2 4.2 4.5  CL 102 102 103 102  CO2 GLUCOSE 176* 128* 134* 177*  BUN 22* 21* 25* 25*  CREATININE 1.41* 1.41* 1.48* 1.45*  CALCIUM 9.0 9.2 9.4 9.7   GFR: Estimated Creatinine Clearance: 57.6 mL/min (A) (by C-G formula based on SCr of 1.45 mg/dL (H)). Liver Function Tests: Recent Labs  Lab 12/29/21 0843  AST 18  ALT 13  ALKPHOS 68  BILITOT 0.8  PROT 7.9  ALBUMIN 3.5   No results for input(s): LIPASE, AMYLASE in the last 168 hours. No results for input(s): AMMONIA in the last 168 hours. Coagulation Profile: Recent Labs  Lab 12/30/21 1046  INR 1.2   Cardiac Enzymes: Recent Labs  Lab 12/29/21 0843  CKTOTAL 51   BNP (last 3 results) No results for input(s): PROBNP in the last 8760 hours. HbA1C: No results for input(s): HGBA1C in the last 72 hours.  CBG: Recent Labs  Lab 01/01/22 2020 01/01/22 2352 01/02/22 0449 01/02/22 0757 01/02/22  1133  GLUCAP 169* 157* 178* 125* 131*   Lipid Profile: No results for input(s): CHOL, HDL, LDLCALC, TRIG, CHOLHDL, LDLDIRECT in the last 72 hours.  Thyroid Function Tests: No results for input(s): TSH, T4TOTAL, FREET4, T3FREE, THYROIDAB in the last 72 hours. Anemia Panel: No results for input(s): VITAMINB12, FOLATE, FERRITIN, TIBC, IRON, RETICCTPCT in the last 72 hours.  Sepsis Labs: Recent Labs  Lab 12/29/21 1037 01/01/22 1306  PROCALCITON  --  <0.10  LATICACIDVEN 1.5  --     Recent Results (from the past 240 hour(s))  Resp Panel by RT-PCR (Flu A&B, Covid) Nasopharyngeal Swab     Status: None   Collection Time: 12/29/21  8:43 AM   Specimen: Nasopharyngeal Swab; Nasopharyngeal(NP) swabs in vial transport medium  Result Value Ref Range Status   SARS Coronavirus 2 by RT PCR NEGATIVE NEGATIVE Final    Comment: (NOTE) SARS-CoV-2 target nucleic acids are NOT DETECTED.  The SARS-CoV-2 RNA is generally detectable in upper respiratory specimens during the acute phase  of infection. The lowest concentration of SARS-CoV-2 viral copies this assay can detect is 138 copies/mL. A negative result does not preclude SARS-Cov-2 infection and should not be used as the sole basis for treatment or other patient management decisions. A negative result may occur with  improper specimen collection/handling, submission of specimen other than nasopharyngeal swab, presence of viral mutation(s) within the areas targeted by this assay, and inadequate number of viral copies(<138 copies/mL). A negative result must be combined with clinical observations, patient history, and epidemiological information. The expected result is Negative.  Fact Sheet for Patients:  BloggerCourse.com  Fact Sheet for Healthcare Providers:  SeriousBroker.it  This test is no t yet approved or cleared by the [place] FDA and  has been authorized for detection and/or diagnosis of SARS-CoV-2 by FDA under an Emergency Use Authorization (EUA). This EUA will remain  in effect (meaning this test can be used) for the duration of the COVID-19 declaration under Section 564(b)(1) of the Act, 21 U.S.C.section 360bbb-3(b)(1), unless the authorization is terminated  or revoked sooner.       Influenza A by PCR NEGATIVE NEGATIVE Final   Influenza B by PCR NEGATIVE NEGATIVE Final    Comment: (NOTE) The Xpert Xpress SARS-CoV-2/FLU/RSV plus assay is intended as an aid in the diagnosis of influenza from Nasopharyngeal swab specimens and should not be used as a sole basis for treatment. Nasal washings and aspirates are unacceptable for Xpert Xpress SARS-CoV-2/FLU/RSV testing.  Fact Sheet for Patients: BloggerCourse.com  Fact Sheet for Healthcare Providers: SeriousBroker.it  This test is not yet approved or cleared by the [place] FDA and has been authorized for detection and/or diagnosis of  SARS-CoV-2 by FDA under an Emergency Use Authorization (EUA). This EUA will remain in effect (meaning this test can be used) for the duration of the COVID-19 declaration under Section 564(b)(1) of the Act, 21 U.S.C. section 360bbb-3(b)(1), unless the authorization is terminated or revoked.  Performed at [facility], [address], [place], [place] [address]   Urine Culture     Status: Abnormal   Collection Time: 12/29/21  9:40 AM   Specimen: Urine, Clean Catch  Result Value Ref Range Status   Specimen Description   Final    URINE, CLEAN CATCH Performed at [facility], [address] [place], [place] [address]    Special Requests   Final    NONE Performed at [facility], [address], [place], [place] [address]  Culture (A)  Final    >=100,000 COLONIES/mL PROTEUS MIRABILIS 60,000 COLONIES/mL KLEBSIELLA PNEUMONIAE    Report Status 01/02/2022 FINAL  Final   Organism ID, Bacteria PROTEUS MIRABILIS (A)  Final   Organism ID, Bacteria KLEBSIELLA PNEUMONIAE (A)  Final      Susceptibility   Klebsiella pneumoniae - MIC*    AMPICILLIN >=32 RESISTANT Resistant     CEFAZOLIN <=4 SENSITIVE Sensitive     CEFEPIME <=0.12 SENSITIVE Sensitive     CEFTRIAXONE <=0.25 SENSITIVE Sensitive     CIPROFLOXACIN <=0.25 SENSITIVE Sensitive     GENTAMICIN <=1 SENSITIVE Sensitive     IMIPENEM <=0.25 SENSITIVE Sensitive     NITROFURANTOIN <=16 SENSITIVE Sensitive     TRIMETH/SULFA <=20 SENSITIVE Sensitive     AMPICILLIN/SULBACTAM 8 SENSITIVE Sensitive     PIP/TAZO <=4 SENSITIVE Sensitive     * 60,000 COLONIES/mL KLEBSIELLA PNEUMONIAE   Proteus mirabilis - MIC*    AMPICILLIN 4 SENSITIVE Sensitive     CEFAZOLIN 8 SENSITIVE Sensitive     CEFEPIME <=0.12 SENSITIVE Sensitive     CEFTRIAXONE <=0.25 SENSITIVE Sensitive     CIPROFLOXACIN <=0.25 SENSITIVE Sensitive     GENTAMICIN <=1 SENSITIVE Sensitive     IMIPENEM 2 SENSITIVE Sensitive     NITROFURANTOIN 128  RESISTANT Resistant     TRIMETH/SULFA <=20 SENSITIVE Sensitive     AMPICILLIN/SULBACTAM <=2 SENSITIVE Sensitive     PIP/TAZO <=4 SENSITIVE Sensitive     * >=100,000 COLONIES/mL PROTEUS MIRABILIS  CULTURE, BLOOD (ROUTINE X 2) w Reflex to ID Panel     Status: Abnormal   Collection Time: 12/30/21 10:34 AM   Specimen: BLOOD  Result Value Ref Range Status   Specimen Description   Final    BLOOD RIGHT [facility] Performed at [facility], [address], [place], [place] [address]    Special Requests   Final    BOTTLES DRAWN AEROBIC AND ANAEROBIC Blood Culture adequate volume Performed at [facility], [address] [place], [place] [address]    Culture  Setup Time   Final    GRAM POSITIVE COCCI AEROBIC BOTTLE ONLY CRITICAL RESULT CALLED TO, READ BACK BY AND VERIFIED WITH: [facility] MITCHELL 12/31/21 1142 MW    Culture (A)  Final    STAPHYLOCOCCUS EPIDERMIDIS THE SIGNIFICANCE OF ISOLATING THIS ORGANISM FROM A SINGLE SET OF BLOOD CULTURES WHEN MULTIPLE SETS ARE DRAWN IS UNCERTAIN. PLEASE NOTIFY THE MICROBIOLOGY DEPARTMENT WITHIN ONE WEEK IF SPECIATION AND SENSITIVITIES ARE REQUIRED. Performed at [facility], [address], [place], [place] [address]    Report Status 01/02/2022 FINAL  Final  Blood Culture ID Panel (Reflexed)     Status: Abnormal   Collection Time: 12/30/21 10:34 AM  Result Value Ref Range Status   Enterococcus faecalis NOT DETECTED NOT DETECTED Final   Enterococcus Faecium NOT DETECTED NOT DETECTED Final   Listeria monocytogenes NOT DETECTED NOT DETECTED Final   Staphylococcus species DETECTED (A) NOT DETECTED Final    Comment: CRITICAL RESULT CALLED TO, READ BACK BY AND VERIFIED WITH: DEVAN MITCHELL 12/31/21 1143 MW    Staphylococcus aureus (BCID) NOT DETECTED NOT DETECTED Final   Staphylococcus epidermidis DETECTED (A) NOT DETECTED Final    Comment: CRITICAL RESULT CALLED TO, READ BACK BY AND VERIFIED WITH: DEVAN MITCHELL 12/31/21 1143 MW     Staphylococcus lugdunensis NOT DETECTED NOT DETECTED Final   Streptococcus species NOT DETECTED NOT DETECTED Final   Streptococcus agalactiae NOT DETECTED NOT DETECTED Final   Streptococcus pneumoniae  NOT DETECTED NOT DETECTED Final   Streptococcus pyogenes NOT DETECTED NOT DETECTED Final   A.calcoaceticus-baumannii NOT DETECTED NOT DETECTED Final   Bacteroides fragilis NOT DETECTED NOT DETECTED Final   Enterobacterales NOT DETECTED NOT DETECTED Final   Enterobacter cloacae complex NOT DETECTED NOT DETECTED Final   Escherichia coli NOT DETECTED NOT DETECTED Final   Klebsiella aerogenes NOT DETECTED NOT DETECTED Final   Klebsiella oxytoca NOT DETECTED NOT DETECTED Final   Klebsiella pneumoniae NOT DETECTED NOT DETECTED Final   Proteus species NOT DETECTED NOT DETECTED Final   Salmonella species NOT DETECTED NOT DETECTED Final   Serratia marcescens NOT DETECTED NOT DETECTED Final   Haemophilus influenzae NOT DETECTED NOT DETECTED Final   Neisseria meningitidis NOT DETECTED NOT DETECTED Final   Pseudomonas aeruginosa NOT DETECTED NOT DETECTED Final   Stenotrophomonas maltophilia NOT DETECTED NOT DETECTED Final   Candida albicans NOT DETECTED NOT DETECTED Final   Candida auris NOT DETECTED NOT DETECTED Final   Candida glabrata NOT DETECTED NOT DETECTED Final   Candida krusei NOT DETECTED NOT DETECTED Final   Candida parapsilosis NOT DETECTED NOT DETECTED Final   Candida tropicalis NOT DETECTED NOT DETECTED Final   Cryptococcus neoformans/gattii NOT DETECTED NOT DETECTED Final   Methicillin resistance mecA/C NOT DETECTED NOT DETECTED Final    Comment: Performed at [facility], [address] [place], [place] [address]  CULTURE, BLOOD (ROUTINE X 2) w Reflex to ID Panel     Status: None (Preliminary result)   Collection Time: 12/30/21 10:41 AM   Specimen: BLOOD  Result Value Ref Range Status   Specimen Description BLOOD LEFT AC  Final   Special Requests   Final    BOTTLES  DRAWN AEROBIC AND ANAEROBIC Blood Culture adequate volume   Culture   Final    NO GROWTH 3 DAYS Performed at [facility], [address] [place], [place] [address]    Report Status PENDING  Incomplete  CULTURE, BLOOD (ROUTINE X 2) w Reflex to ID Panel     Status: None (Preliminary result)   Collection Time: 12/31/21  1:25 PM   Specimen: BLOOD  Result Value Ref Range Status   Specimen Description BLOOD LEFT AC  Final   Special Requests   Final    BOTTLES DRAWN AEROBIC ONLY Blood Culture results may not be optimal due to an inadequate volume of blood received in culture bottles   Culture   Final    NO GROWTH 2 DAYS Performed at [facility], [address] [place], [place] [address]    Report Status PENDING  Incomplete  CULTURE, BLOOD (ROUTINE X 2) w Reflex to ID Panel     Status: None (Preliminary result)   Collection Time: 12/31/21  1:26 PM   Specimen: BLOOD  Result Value Ref Range Status   Specimen Description BLOOD RIGHT [facility]  Final   Special Requests   Final    BOTTLES DRAWN AEROBIC AND ANAEROBIC Blood Culture adequate volume   Culture   Final    NO GROWTH 2 DAYS Performed at [facility], [address] [place], [place] [address]    Report Status PENDING  Incomplete  Chlamydia/NGC rt PCR ([facility] only)     Status: None   Collection Time: 12/31/21  3:43 PM   Specimen: Urine  Result Value Ref Range Status   Specimen source GC/Chlam ENDOCERVICAL  Final   Chlamydia Tr NOT DETECTED NOT DETECTED Final   N gonorrhoeae NOT DETECTED NOT DETECTED Final  Comment: (NOTE) This CT/NG assay has not been evaluated in patients with a history of  hysterectomy. Performed at [facility], [address], [place], [place] [address]          Radiology Studies: No results found.      Scheduled Meds:  apixaban  5 mg Oral BID   ARIPiprazole  2 mg Oral Daily   atorvastatin  40 mg Oral Daily   chlorthalidone  25 mg Oral Daily   gabapentin   300 mg Oral BID   insulin aspart  0-15 Units Subcutaneous TID WC   insulin aspart  0-5 Units Subcutaneous QHS   insulin glargine-yfgn  25 Units Subcutaneous QHS   lisinopril  20 mg Oral Daily   loratadine  10 mg Oral Daily   methocarbamol  500 mg Oral TID   metoprolol succinate  25 mg Oral Daily   pantoprazole  40 mg Oral Q breakfast   prednisoLONE  15 mg Oral Daily   sertraline  37.5 mg Oral Daily   Continuous Infusions:     LOS: 3 days      Tresa Moore, MD [facility]   If 7PM-7AM, please contact night-coverage  01/02/2022, 1:07 PM

## 2022-01-02 NOTE — Progress Notes (Signed)
Occupational Therapy Treatment ?Patient Details ?Name: Terry Noble ?MRN: [id number] ?DOB: 1966-04-24 ?Today's Date: 01/02/2022 ? ? ?History of present illness Terry Noble is a 56 y.o. female with medical history significant for morbid obesity, hypertension, diabetes mellitus, sleep apnea and CHF who presents to the ER for evaluation of a 2-day history of left-sided weakness. She denies having any difficulty swallowing or slurred speech.  She complains of blurred vision but denies having any headache, no dizziness, no lightheadedness, no nausea or vomiting.  She reports that after 2 days of weakness, she rolled out of bed and landed on the floor and could not get up due to the weakness, so called EMS. She denies having any chest pain, no shortness of breath, no changes in her bowel habits, no back pain, no fever, no cough, no chills, no urinary symptoms. Initial CT scan of the head without contrast does not show an acute bleed. ?  ?OT comments ? Terry Noble was seen for OT treatment on this date. Upon arrival to room pt seated EOB, agreeable to tx. Pt defers standing trials having just completed working with PT however agreeable to therex. Pt tolerated seated BUE/BLE exercises as described below. IS provided and educated on frequency/use. Pt tolerates >15 min sitting with good dynamic balance. MIN A sit>sup, assist for LLE mgmt. Pt continues to state plan to return home and complete mobility with +1 assist, will continue to follow POC and assess insight in to deficits. Discharge recommendation remains appropriate.  ?  ? ?Recommendations for follow up therapy are one component of a multi-disciplinary discharge planning process, led by the attending physician.  Recommendations may be updated based on patient status, additional functional criteria and insurance authorization. ?   ?Follow Up Recommendations ? Skilled nursing-short term rehab (<3 hours/day)  ?  ?Assistance Recommended at Discharge Frequent or constant  Supervision/Assistance  ?Patient can return home with the following ? A lot of help with bathing/dressing/bathroom;Assistance with cooking/housework;Assist for transportation;Help with stairs or ramp for entrance;Two people to help with walking and/or transfers ?  ?Equipment Recommendations ? Other (comment) (defer)  ?  ?Recommendations for Other Services   ? ?  ?Precautions / Restrictions Precautions ?Precautions: Fall ?Restrictions ?Weight Bearing Restrictions: No  ? ? ?  ? ?Mobility Bed Mobility ?Overal bed mobility: Needs Assistance ?Bed Mobility: Sit to Supine ?  ?  ?  ?Sit to supine: Min assist ?  ?General bed mobility comments: assist for ?  ? ?Transfers ?  ?  ?  ?  ?  ?  ?  ?  ?  ?General transfer comment: pt defers citing previous attmepts. Discussed lateral scoot and pt declines at this time ?  ?  ?Balance Overall balance assessment: Needs assistance ?Sitting-balance support: Feet supported, No upper extremity supported ?Sitting balance-Leahy Scale: Normal ?  ?  ?  ?  ?  ?  ?  ?  ?  ?  ?  ?  ?  ?  ?  ?  ?   ? ?ADL either performed or assessed with clinical judgement  ? ?ADL Overall ADL's : Needs assistance/impaired ?  ?  ?  ?  ?  ?  ?  ?  ?  ?  ?  ?  ?  ?  ?  ?  ?  ?  ?  ?General ADL Comments: MAX A for LB access seated EOB. SETUP + SUPERVISION seated UB grooming tasks with good balance, tolerates >15 min sitting ?  ? ? ? ?Cognition  Arousal/Alertness: Awake/alert ?Behavior During Therapy: [facility] for tasks assessed/performed ?Overall Cognitive Status: Within Functional Limits for tasks assessed ?  ?  ?  ?  ?  ?  ?  ?  ?  ?  ?  ?  ?  ?  ?  ?  ?General Comments: questionable insight into deficits as pt states plan to d/c home to 2nd story apartment ?  ?  ?   ?Exercises Exercises: General Upper Extremity, General Lower Extremity ?General Exercises - Upper Extremity ?Shoulder Flexion: AROM, Strengthening, Both, 20 reps, Seated ?Shoulder Extension: AROM, Strengthening, Both, 20 reps, Seated ?Shoulder ABduction:  AROM, Strengthening, Both, 20 reps, Seated ?Shoulder ADduction: AROM, Strengthening, Both, 20 reps, Seated ?Shoulder Horizontal ABduction: AROM, Strengthening, Both, 20 reps, Seated ?Shoulder Horizontal ADduction: AROM, Strengthening, Both, 20 reps, Seated ?Elbow Flexion: AROM, Strengthening, Both, 20 reps, Seated ?Elbow Extension: AROM, Strengthening, Both, 20 reps, Seated ?General Exercises - Lower Extremity ?Gluteal Sets: AROM, Strengthening, Both, 20 reps, Seated ?Hip Flexion/Marching: AROM, Strengthening, Both, 20 reps, Seated ?Toe Raises: AROM, Strengthening, Both, 20 reps, Seated ?Other Exercises ?Other Exercises: IS (provided) x10, crunches sitting and supine ? ?  ?   ?   ? ? ?Pertinent Vitals/ Pain       Pain Assessment ?Pain Assessment: Faces ?Faces Pain Scale: Hurts even more ?Pain Location: Left hip/L knee ?Pain Descriptors / Indicators: Grimacing, Guarding, Pounding, Moaning ?Pain Intervention(s): Limited activity within patient's tolerance, Repositioned, Premedicated before session ? ? ?Frequency ? Min 2X/week  ? ? ? ? ?  ?Progress Toward Goals ? ?OT Goals(current goals can now be found in the care plan section) ? Progress towards OT goals: Progressing toward goals ? ?Acute Rehab OT Goals ?Patient Stated Goal: to walk ?OT Goal Formulation: With patient ?Time For Goal Achievement: 01/13/22 ?Potential to Achieve Goals: Good ?ADL Goals ?Pt Will Perform Grooming: with modified independence;sitting ?Pt Will Transfer to Toilet: with modified independence;ambulating;regular height toilet;stand pivot transfer ?Pt/caregiver will Perform Home Exercise Program: Increased ROM;Increased strength;Independently ?Additional ADL Goal #1: Pt will be able to identify/demonstrate 2+ techniques for managing diabetes.  ?Plan Discharge plan remains appropriate;Frequency remains appropriate   ? ?Co-evaluation ? ? ?   ?  ?  ?  ?  ? ?  ?AM-PAC OT "6 Clicks" Daily Activity     ?Outcome Measure ? ? Help from another person eating  meals?: A Little ?Help from another person taking care of personal grooming?: A Little ?Help from another person toileting, which includes using toliet, bedpan, or urinal?: A Lot ?Help from another person bathing (including washing, rinsing, drying)?: A Lot ?Help from another person to put on and taking off regular upper body clothing?: A Little ?Help from another person to put on and taking off regular lower body clothing?: A Lot ?6 Click Score: 15 ? ?  ?End of Session   ? ?OT Visit Diagnosis: Muscle weakness (generalized) (M62.81);Pain;Unsteadiness on feet (R26.81) ?Pain - Right/Left: Left ?Pain - part of body: Knee ?  ?Activity Tolerance Patient tolerated treatment well;Patient limited by pain ?  ?Patient Left in bed;with call bell/phone within reach;with bed alarm set ?  ?Nurse Communication   ?  ? ?   ? ?Time: [id number] ?OT Time Calculation (min): 23 min ? ?Charges: OT General Charges ?$OT Visit: 1 Visit ?OT Treatments ?$Therapeutic Exercise: 23-37 mins ? ?Dessie Coma, M.S. OTR/L  ?01/02/22, 2:46 PM  ?ascom [phone] ? ?

## 2022-01-02 NOTE — Progress Notes (Signed)
Physical Therapy Treatment ?Patient Details ?Name: Terry Noble ?MRN: [id number] ?DOB: 11-19-1965 ?Today's Date: 01/02/2022 ? ? ?History of Present Illness Terry Noble is a 56 y.o. female with medical history significant for morbid obesity, hypertension, diabetes mellitus, sleep apnea and CHF who presents to the ER for evaluation of a 2-day history of left-sided weakness. She denies having any difficulty swallowing or slurred speech.  She complains of blurred vision but denies having any headache, no dizziness, no lightheadedness, no nausea or vomiting.  She reports that after 2 days of weakness, she rolled out of bed and landed on the floor and could not get up due to the weakness, so called EMS. She denies having any chest pain, no shortness of breath, no changes in her bowel habits, no back pain, no fever, no cough, no chills, no urinary symptoms. Initial CT scan of the head without contrast does not show an acute bleed. ? ?  ?PT Comments  ? ? Pt awake in bed on entry, agreeable to session. Left knee reported as stiff, painful, 7/10 this date. Pt agreeable to ROM work to optimize activity tolerance and abiliity to participate with LLE. Supervision to EOB, minA for forward and back scooting at times. Attempted STS 10x with ever changing technique and modifications to maximize functional capacity in the setting of single LE mobility capability and acute onset weakness. Ultimately pt is never able to rise to upright stance with any of these strategies. Pt recently refusing STR placement earlier this date, also refusing EMS transport home. Pt has been nonambulatory since working with PT here 4 days prior, has 17 steps to enter home which poses a greater metabolic cost. Despite strong pt/family preferences, it has not been revealed a plan on achieving these mobility requirements at DC. Pt does exhibit a degree of surprise at her inability to rise to standing which may indicate some limited awareness of deficits.  ? ?   ?Recommendations for follow up therapy are one component of a multi-disciplinary discharge planning process, led by the attending physician.  Recommendations may be updated based on patient status, additional functional criteria and insurance authorization. ? ?Follow Up Recommendations ? Skilled nursing-short term rehab (<3 hours/day) ?  ?  ?Assistance Recommended at Discharge Intermittent Supervision/Assistance  ?Patient can return home with the following Two people to help with walking and/or transfers;Two people to help with bathing/dressing/bathroom;Assistance with cooking/housework;Assist for transportation;Help with stairs or ramp for entrance ?  ?Equipment Recommendations ? None recommended by PT  ?  ?Recommendations for Other Services   ? ? ?  ?Precautions / Restrictions Precautions ?Precautions: Fall ?Restrictions ?Weight Bearing Restrictions: No  ?  ? ?Mobility ? Bed Mobility ?Overal bed mobility: Needs Assistance ?Bed Mobility: Supine to Sit ?  ?  ?Supine to sit: HOB elevated, Supervision ?  ?  ?General bed mobility comments: some assistance provided for EOB fwd/backward scootage ?  ? ?Transfers ?Overall transfer level: Needs assistance ?  ?Transfers: Sit to/from Stand ?Sit to Stand: From elevated surface, Total assist, +2 physical assistance ?  ?  ?  ?  ?  ?General transfer comment: multiple attempts, multiple techniques, ultimately unable to rise to standing ?  ? ?Ambulation/Gait ?  ?  ?  ?  ?  ?  ?  ?  ? ? ?Stairs ?  ?  ?  ?  ?  ? ? ?Wheelchair Mobility ?  ? ?Modified Rankin (Stroke Patients Only) ?  ? ? ?  ?Balance Overall balance assessment: Needs assistance ?Sitting-balance  support: Feet supported, No upper extremity supported ?Sitting balance-Leahy Scale: Normal ?  ?  ?  ?  ?  ?  ?  ?  ?  ?  ?  ?  ?  ?  ?  ?  ?  ? ?  ?Cognition Arousal/Alertness: Awake/alert ?Behavior During Therapy: [facility] for tasks assessed/performed ?Overall Cognitive Status: Within Functional Limits for tasks assessed ?  ?  ?  ?   ?  ?  ?  ?  ?  ?  ?  ?  ?  ?  ?  ?  ?  ?  ?  ? ?  ?Exercises Other Exercises ?Other Exercises: LLE SAQ (partial ROM AA/ROM) 2x10, LLE ABDCT/ADD heel slides x15, LLE quad set ?Other Exercises: RLE sagittal heel slides 1x10, RLE manually resisted leg press 2x10, SAQ x15 ?Other Exercises: seated EOB RLE LAQ 1x10 ?Other Exercises: seated EOB Left hamstring set 1x10 ? ?  ?General Comments   ?  ?  ? ?Pertinent Vitals/Pain Pain Assessment ?Pain Score: 7  ?Pain Location: Left knee  ? ? ?Home Living   ?  ?  ?  ?  ?  ?  ?  ?  ?  ?   ?  ?Prior Function    ?  ?  ?   ? ?PT Goals (current goals can now be found in the care plan section) Acute Rehab PT Goals ?Patient Stated Goal: to decrease pain and get stronger to go home ?PT Goal Formulation: With patient ?Time For Goal Achievement: 01/13/22 ?Potential to Achieve Goals: Fair ?Progress towards PT goals: Progressing toward goals ? ?  ?Frequency ? ? ? Min 2X/week ? ? ? ?  ?PT Plan Current plan remains appropriate  ? ? ?Co-evaluation   ?  ?  ?  ?  ? ?  ?AM-PAC PT "6 Clicks" Mobility   ?Outcome Measure ? Help needed turning from your back to your side while in a flat bed without using bedrails?: A Little ?Help needed moving from lying on your back to sitting on the side of a flat bed without using bedrails?: A Little ?Help needed moving to and from a bed to a chair (including a wheelchair)?: Total ?Help needed standing up from a chair using your arms (e.g., wheelchair or bedside chair)?: Total ?Help needed to walk in hospital room?: Total ?Help needed climbing 3-5 steps with a railing? : Total ?6 Click Score: 10 ? ?  ?End of Session   ?Activity Tolerance: Patient tolerated treatment well ?Patient left: in bed;with call bell/phone within reach ?Nurse Communication: Mobility status ?PT Visit Diagnosis: History of falling (Z91.81);Other abnormalities of gait and mobility (R26.89);Unsteadiness on feet (R26.81);Pain;Muscle weakness (generalized) (M62.81) ?Pain - Right/Left: Left ?Pain -  part of body: Shoulder;Arm;Hand;Knee;Ankle and joints of foot ?  ? ? ?Time: [id number] ?PT Time Calculation (min) (ACUTE ONLY): 36 min ? ?Charges:  $Therapeutic Exercise: 8-22 mins ?$Therapeutic Activity: 8-22 mins          ?          ?2:38 PM, 01/02/22 ?Etta Grandchild, PT, DPT ?Physical Therapist - [place] ?[facility]  ?[phone] (ASCOM)  ? ? ? ?Orvill Coulthard C ?01/02/2022, 2:33 PM ? ?

## 2022-01-02 NOTE — TOC Progression Note (Signed)
Transition of Care (TOC) - Progression Note  ? ? ?Patient Details  ?Name: Terry Noble ?MRN: [id number] ?Date of Birth: 1966/09/04 ? ?Transition of Care (TOC) CM/SW Contact  ?Beverly Sessions, RN ?Phone Number: ?01/02/2022, 2:14 PM ? ?Clinical Narrative:    ? ?Met with patient and daughter at bedside.  They both confirm plan remains to go home at discharge with home health services at discharge through [facility] health.  ? ?Patient declines EMS transport. However she will have 17 steps to get up to enter the apartment. ?At this time she is not able to stand with PT ? ?Expected Discharge Plan: [place] ?Barriers to Discharge: Continued Medical Work up ? ?Expected Discharge Plan and Services ?Expected Discharge Plan: [place] ?  ?Discharge Planning Services: CM Consult ?  ?  ?                ?  ?  ?  ?  ?  ?  ?  ?  ?  ?  ? ? ?Social Determinants of Health (SDOH) Interventions ?  ? ?Readmission Risk Interventions ?No flowsheet data found. ? ?

## 2022-01-03 ENCOUNTER — Telehealth: Payer: Self-pay | Admitting: Pharmacy Technician

## 2022-01-03 ENCOUNTER — Other Ambulatory Visit: Payer: Self-pay

## 2022-01-03 LAB — BASIC METABOLIC PANEL
Anion gap: 7 (ref 5–15)
BUN: 34 mg/dL — ABNORMAL HIGH (ref 6–20)
CO2: 26 mmol/L (ref 22–32)
Calcium: 9.6 mg/dL (ref 8.9–10.3)
Chloride: 106 mmol/L (ref 98–111)
Creatinine, Ser: 1.43 mg/dL — ABNORMAL HIGH (ref 0.44–1.00)
GFR, Estimated: 44 mL/min — ABNORMAL LOW (ref >60)
Glucose, Bld: 170 mg/dL — ABNORMAL HIGH (ref 70–99)
Potassium: 4.2 mmol/L (ref 3.5–5.1)
Sodium: 139 mmol/L (ref 135–145)

## 2022-01-03 LAB — RHEUMATOID FACTOR: Rheumatoid fact SerPl-aCnc: 19.7 IU/mL — ABNORMAL HIGH (ref <14.0)

## 2022-01-03 LAB — CBC
HCT: 28.5 % — ABNORMAL LOW (ref 36.0–46.0)
Hemoglobin: 8.2 g/dL — ABNORMAL LOW (ref 12.0–15.0)
MCH: 25 pg — ABNORMAL LOW (ref 26.0–34.0)
MCHC: 28.8 g/dL — ABNORMAL LOW (ref 30.0–36.0)
MCV: 86.9 fL (ref 80.0–100.0)
Platelets: 526 10*3/uL — ABNORMAL HIGH (ref 150–400)
RBC: 3.28 MIL/uL — ABNORMAL LOW (ref 3.87–5.11)
RDW: 14.7 % (ref 11.5–15.5)
WBC: 16.3 10*3/uL — ABNORMAL HIGH (ref 4.0–10.5)
nRBC: 0 % (ref 0.0–0.2)

## 2022-01-03 LAB — GLUCOSE, CAPILLARY
Glucose-Capillary: 119 mg/dL — ABNORMAL HIGH (ref 70–99)
Glucose-Capillary: 116 mg/dL — ABNORMAL HIGH (ref 70–99)

## 2022-01-03 MED ORDER — PREDNISONE 10 MG PO TABS
10.0000 mg | ORAL_TABLET | Freq: Every day | ORAL | 0 refills | Status: AC
  Filled 2022-01-03: qty 30, 30d supply, fill #0

## 2022-01-03 MED ORDER — METHOCARBAMOL 500 MG PO TABS
500.0000 mg | ORAL_TABLET | Freq: Three times a day (TID) | ORAL | 0 refills | Status: AC | PRN
  Filled 2022-01-03: qty 20, 7d supply, fill #0

## 2022-01-03 MED ORDER — ATORVASTATIN CALCIUM 40 MG PO TABS
40.0000 mg | ORAL_TABLET | Freq: Every day | ORAL | 0 refills | Status: AC
Start: 2022-01-04 — End: ?
  Filled 2022-01-03: qty 30, 30d supply, fill #0

## 2022-01-03 NOTE — Progress Notes (Signed)
Physical Therapy Treatment ?Patient Details ?Name: Terry Noble ?MRN: [id number] ?DOB: 05/13/1966 ?Today's Date: 01/03/2022 ? ? ?History of Present Illness Terry Noble is a 56 y.o. female with medical history significant for morbid obesity, hypertension, diabetes mellitus, sleep apnea and CHF who presents to the ER for evaluation of a 2-day history of left-sided weakness. She denies having any difficulty swallowing or slurred speech.  She complains of blurred vision but denies having any headache, no dizziness, no lightheadedness, no nausea or vomiting.  She reports that after 2 days of weakness, she rolled out of bed and landed on the floor and could not get up due to the weakness, so called EMS. She denies having any chest pain, no shortness of breath, no changes in her bowel habits, no back pain, no fever, no cough, no chills, no urinary symptoms. Initial CT scan of the head without contrast does not show an acute bleed. ? ?  ?PT Comments  ? ? Pt awake in bed on entry, ready to get working, wants to get stronger and return to independent mobility. minA to EOB, mostly for pain control of left knee and for scooting assist. Worked with BLE in bed prior to improve tolerance and activation. Continued to problem solve with finding the most independent means of achieving any level of standing in her severely deconditioned state, however only with total+2 assist is patient able to partially rise to standing from an elevated surface, a short rise tolerated poorly due to the stress is places on her painful left knee. DTR arrives at end of session, able to bear witness to degree of difficulty and effort pt puts forth with standing, is in agreement that pt managing 17 entry steps is not a possibility in her current state.  ?  ?Recommendations for follow up therapy are one component of a multi-disciplinary discharge planning process, led by the attending physician.  Recommendations may be updated based on patient status,  additional functional criteria and insurance authorization. ? ?Follow Up Recommendations ? Skilled nursing-short term rehab (<3 hours/day) ?  ?  ?Assistance Recommended at Discharge    ?Patient can return home with the following Two people to help with walking and/or transfers;Two people to help with bathing/dressing/bathroom;Assistance with cooking/housework;Assist for transportation;Help with stairs or ramp for entrance ?  ?Equipment Recommendations ? Wheelchair (measurements PT);Wheelchair cushion (measurements PT);BSC/3in1  ?  ?Recommendations for Other Services   ? ? ?  ?Precautions / Restrictions Precautions ?Precautions: Fall ?Restrictions ?Weight Bearing Restrictions: No  ?  ? ?Mobility ? Bed Mobility ?Overal bed mobility: Needs Assistance ?Bed Mobility: Supine to Sit ?  ?  ?Supine to sit: Min assist, Supervision ?  ?  ?  ?  ? ?Transfers ?Overall transfer level: Needs assistance ?  ?Transfers: Sit to/from Stand ?Sit to Stand: From elevated surface, Total assist ?  ?  ?  ?  ?  ?  ?  ? ?Ambulation/Gait ?  ?  ?  ?  ?  ?  ?  ?  ? ? ?Stairs ?  ?  ?  ?  ?  ? ? ?Wheelchair Mobility ?  ? ?Modified Rankin (Stroke Patients Only) ?  ? ? ?  ?Balance   ?  ?  ?  ?  ?  ?  ?  ?  ?  ?  ?  ?  ?  ?  ?  ?  ?  ?  ?  ? ?  ?Cognition Arousal/Alertness: Awake/alert ?[facility]: [facility] for tasks assessed/performed ?  Overall Cognitive Status: Within Functional Limits for tasks assessed ?  ?  ?  ?  ?  ?  ?  ?  ?  ?  ?  ?  ?  ?  ?  ?  ?  ?  ?  ? ?  ?Exercises Other Exercises ?Other Exercises: LLE SAQ AA/ROM 2x15 (significant difficulty with ROM, lacking >25 degrees from TKE) ?Other Exercises: RLE heel slides AA/ROM 1x15 ?Other Exercises: RLE AR/ROM single leg press 1x15 (very limited in force production) ?Other Exercises: LLE frontal plane heel slides ABDCT/ADD AA/ROM 1x15 (ROM very limited) ? ?  ?General Comments   ?  ?  ? ?Pertinent Vitals/Pain Pain Assessment ?Pain Assessment: Faces ?Pain Score: 6  ?Pain Location: L knee   ? ? ?Home Living   ?  ?  ?  ?  ?  ?  ?  ?  ?  ?   ?  ?Prior Function    ?  ?  ?   ? ?PT Goals (current goals can now be found in the care plan section) Acute Rehab PT Goals ?Patient Stated Goal: to decrease pain and get stronger to go home ?PT Goal Formulation: With patient ?Time For Goal Achievement: 01/13/22 ?Potential to Achieve Goals: Fair ?Progress towards PT goals: Progressing toward goals ? ?  ?Frequency ? ? ? Min 2X/week ? ? ? ?  ?PT Plan Current plan remains appropriate  ? ? ?Co-evaluation   ?  ?  ?  ?  ? ?  ?AM-PAC PT "6 Clicks" Mobility   ?Outcome Measure ? Help needed turning from your back to your side while in a flat bed without using bedrails?: A Lot ?Help needed moving from lying on your back to sitting on the side of a flat bed without using bedrails?: A Lot ?Help needed moving to and from a bed to a chair (including a wheelchair)?: Total ?Help needed standing up from a chair using your arms (e.g., wheelchair or bedside chair)?: Total ?Help needed to walk in hospital room?: Total ?Help needed climbing 3-5 steps with a railing? : Total ?6 Click Score: 8 ? ?  ?End of Session Equipment Utilized During Treatment: Gait belt ?Activity Tolerance: Patient tolerated treatment well;Patient limited by fatigue;Patient limited by pain ?Patient left: in bed;with call bell/phone within reach;with family/visitor present ?Nurse Communication: Mobility status ?PT Visit Diagnosis: History of falling (Z91.81);Other abnormalities of gait and mobility (R26.89);Unsteadiness on feet (R26.81);Pain;Muscle weakness (generalized) (M62.81) ?Pain - Right/Left: Left ?Pain - part of body: Shoulder;Arm;Hand;Knee;Ankle and joints of foot ?  ? ? ?Time: 1610-9604 ?PT Time Calculation (min) (ACUTE ONLY): 33 min ? ?Charges:  $Therapeutic Exercise: 23-37 mins          ?          ?2:53 PM, 01/03/22 ?Rosamaria Lints, PT, DPT ?Physical Therapist - [place] ?[facility]  ?[phone] (ASCOM)  ?s ? ? ?Terry Noble  C ?01/03/2022, 2:49 PM ? ?

## 2022-01-03 NOTE — Telephone Encounter (Signed)
Patient has prescription drug coverage with Medicaid.  Does not meet [facility]'s eligibility criteria.  Patient notified. ? ?Sherilyn Dacosta ?Care Manager ?[facility] ?

## 2022-01-03 NOTE — Discharge Summary (Signed)
Physician Discharge Summary  Constance Whittle VOJ:[id number] DOB: 1966/03/11 DOA: 12/29/2021  PCP: Letta Median, MD  Admit date: 12/29/2021 Discharge date: 01/03/2022  Admitted From: Home Disposition:  Home  Recommendations for Outpatient Follow-up:  Follow up with PCP in 1-2 weeks Follow up with rheumatology within 2 weeks    Home Health:No  Equipment/Devices:None   Discharge Condition:Stable  CODE STATUS:FULL  Diet recommendation: Heart healthy  Brief/Interim Summary: 56 y.o. female with medical history significant for morbid obesity, hypertension, diabetes mellitus, sleep apnea and CHF who presents to the ER for evaluation of a 2-day history of left-sided weakness. Patient notes that she has had numbness and tingling in her left upper and lower extremity since Friday and thought it was an allergic reaction.  She took her EpiPen and Benadryl without any improvement and so she called EMS. She denies having any difficulty swallowing or slurred speech.  She complains of blurred vision but denies having any headache, no dizziness, no lightheadedness, no nausea or vomiting.  She states that she rolled out of bed this morning and landed on the floor and could not get up due to the weakness.  She also voided on herself because she was too weak to get up. She denies having any chest pain, no shortness of breath, no changes in her bowel habits, no back pain, no fever, no cough, no chills, no urinary symptoms. Initial CT scan of the head without contrast does not show an acute bleed.   Not noted at time of admission but patient was recently admitted to [facility], discharged to [facility], recently returned home. Admission was for uti/cellulitis with sepsis, required pressors, also had hhs, also new dvt and aki.    Hospital course focused around the etiology of patient's progressive weakness, joint pain, decreased ability to ambulate.  Autoimmune work-up significant for elevated inflammatory markers including  ESR and CRP as well as a positive rheumatoid factor.  Given her symptoms this indicates possible underlying new onset rheumatoid arthritis.  More started on p.o. steroids in the hospital with good result.  Grip strength and range of motion did increase at time of discharge.  On discharge will recommend low-dose prednisone 10 mg daily.  30-day supply prescribed.  Had a lengthy discussion with patient and daughter at time of discharge.  Instructed them that they need to follow-up with [facility] rheumatology.  Provider office phone number included in discharge instructions.  Patient will need to see her PCP within 1 to 2 weeks of discharge.  She may be able to get a referral for rheumatology from the primary care physician.  Of note therapy recommendations were for skilled nursing facility placement.  However after lengthy discussion patient and her family elected to go back home.  Unfortunately we were not able to secure home health services due to insurance barriers.  Daughter and patient are aware of this and continue to elect to go home.  Discharge Diagnoses:  Principal Problem:   Weakness Active Problems:   CHF (congestive heart failure) (HCC)   Diabetes (HCC)   HTN (hypertension)   Obstructive sleep apnea   Morbid obesity (HCC)   Depression   CKD (chronic kidney disease) stage 3, GFR 30-59 ml/min (HCC)   SIRS (systemic inflammatory response syndrome) ([place])  # Weakness Patient presents with left sided weakness with initial concern for stroke. Review of previous admission note shows this to be chronic. Ct and mri are negative, this does not appear to be stroke.  I suspect this is representative  of new diagnosis of rheumatoid arthritis.  Elevated inflammatory markers, positive RF assay. Plan: Discharge home.  Patient and family refused skilled nursing facility placement.  We were not able to secure home health services due to insurance barriers.  At time of discharge will recommend prednisone 10  mg daily.  30-day supply prescribed.  Patient instructed to follow-up with rheumatology within 2 weeks of discharge.   # SIRS # Bacteriuria Recent admission for cellulitis and uti sepsis, treated. Here febrile, tachycardic. Last fever AM 3/6. no signs cellulitis. Doesn't endorse uti symptoms but urine is growing GNRs, had e coli at [facility] in January that was pan sensitive. No blood in urine to suggest stone, no abd pain. No diarrhea. Some concern for septic joint but has diffuse joint pains, x-rays showing only chronic OA changes, today better able to move them, and ortho thinks septic arthritis less likely.  No respiratory symptoms, cxr clear, covid/flu neg. No AMS or neck pain/stiffness to suggest meningitis. Possible rheumatologic problem? 1/4 blood cultures growing staph epi, a likely contaminant. No thrombus on TTE. Uric acid is mildly elevated. Inflammatory markers are elevated -Repeat set of blood cultures negative -Urine culture with Proteus mirabilis and Klebsiella pneumonia, pansensitive -Procalcitonin negative indicating likely contaminant versus chronic bacteria Plan: No indication of acute infection on discharge.  No antibiotics indicated   # DVT Recent diagnosis, on eliquis - cont eliquis   # MDD - home abilify   # Neuropathy - home gabapentin   # T2DM Recent admit for hhs. Here glucose wnl Hemoglobin A1c 11.8 Resume home regimen on discharge Dietary restrictions provided by diabetes coordinator Outpatient PCP follow-up   # CKD 3b Creatinine at or around baseline of 1.4 Creatinine stable at time of discharge   #Iron deficiency anemia Hgb 8s is at baseline -IV Venofer x1 on 3/8   # HFpEF Appears euvolemic. TTE stable   # HTN Here mild elevation - homne lisinopril - resume home chlorthalidone   # Neuropathy - home gabapentin   # OSA - cpap qhs   #Stage III obesity BMI 44 This complicates overall care and prognosis  Discharge Instructions  Discharge  Instructions     Diet - low sodium heart healthy   Complete by: As directed    Increase activity slowly   Complete by: As directed       Allergies as of 01/03/2022       Reactions   Penicillins Shortness Of Breath   Itching, swelling   Shellfish Allergy Hives, Shortness Of Breath   Cherry    Cucumber Extract Hives   pickles   Lasix [furosemide] Swelling   Orange Fruit [citrus] Hives   Oxycodone    Peanut-containing Drug Products Hives        Medication List     STOP taking these medications    Januvia 25 MG tablet Generic drug: sitaGLIPtin   metFORMIN 1000 MG tablet Commonly known as: GLUCOPHAGE   torsemide 20 MG tablet Commonly known as: DEMADEX       TAKE these medications    acetaminophen 500 MG tablet Commonly known as: TYLENOL Take 500 mg by mouth every 6 (six) hours as needed.   ARIPiprazole 2 MG tablet Commonly known as: ABILIFY Take 2 mg by mouth daily.   aspirin EC 81 MG tablet Take 81 mg by mouth daily.   atorvastatin 40 MG tablet Commonly known as: LIPITOR Take 1 tablet (40 mg total) by mouth daily. Start taking on: January 04, 2022  chlorthalidone 25 MG tablet Commonly known as: HYGROTON Take 25 mg by mouth daily.   Eliquis 5 MG Tabs tablet Generic drug: apixaban Take 5 mg by mouth 2 (two) times daily.   EPINEPHrine 0.3 mg/0.3 mL Soaj injection Commonly known as: EPI-PEN Inject 0.3 mg into the muscle once.   gabapentin 300 MG capsule Commonly known as: NEURONTIN Take 300 mg by mouth 2 (two) times daily.   Lantus SoloStar 100 UNIT/ML Solostar Pen Generic drug: insulin glargine Inject 25 Units into the skin 2 (two) times daily.   lisinopril 40 MG tablet Commonly known as: ZESTRIL Take 40 mg by mouth daily.   loratadine 10 MG tablet Commonly known as: CLARITIN Take 10 mg by mouth daily.   Magnesium Bisglycinate Dihyd Powd 200 mg by Does not apply route daily.   methocarbamol 500 MG tablet Commonly known as:  ROBAXIN Take 1 tablet (500 mg total) by mouth every 8 (eight) hours as needed for muscle spasms.   metoprolol succinate 25 MG 24 hr tablet Commonly known as: TOPROL-XL Take 25 mg by mouth daily.   pantoprazole 40 MG tablet Commonly known as: PROTONIX Take 40 mg by mouth daily.   predniSONE 10 MG tablet Commonly known as: DELTASONE Take 1 tablet (10 mg total) by mouth daily.   sertraline 25 MG tablet Commonly known as: ZOLOFT Take 37.5 mg by mouth daily.        Follow-up Information     Bender, Durene Cal, MD. Schedule an appointment as soon as possible for a visit in 1 week(s).   Specialty: Family Medicine Contact information: [place] [phone]         Quintin Alto, MD. Schedule an appointment as soon as possible for a visit in 2 week(s).   Specialty: Rheumatology Why: Suspected new diagnosis rheumatoid arthritis.  Please schedule a follow up appointment with any available provider at [facility] rheumatology within 2 weeks Contact information: [address] [place] [address] [phone]                Allergies  Allergen Reactions   Penicillins Shortness Of Breath    Itching, swelling   Shellfish Allergy Hives and Shortness Of Breath   Cherry    Cucumber Extract Hives    pickles   Lasix [Furosemide] Swelling   Orange Fruit [Citrus] Hives   Oxycodone    Peanut-Containing Drug Products Hives    Consultations: None   Procedures/Studies: DG Chest 2 View  Result Date: 12/29/2021 CLINICAL DATA:  Shortness of breath EXAM: CHEST - 2 VIEW COMPARISON:  12/23/2017 FINDINGS: The heart size and mediastinal contours are within normal limits. Both lungs are clear. The visualized skeletal structures are unremarkable. IMPRESSION: No active cardiopulmonary disease. Electronically Signed   By: Kathreen Devoid M.D.   On: 12/29/2021 11:26   DG Shoulder Right  Result Date: 12/30/2021 CLINICAL DATA:  Right  shoulder pain.  Known injury. EXAM: RIGHT SHOULDER - 2+ VIEW COMPARISON:  None available FINDINGS: Mild acromioclavicular joint space narrowing and peripheral osteophytosis degenerative change. Minimal inferior glenoid and humeral head-neck junction degenerative osteophytosis. Mild-to-moderate cystic change within the superolateral humeral head, possibly from chronic subacromial impingement. Mild distal lateral subacromial spurring. No acute fracture or dislocation. The visualized portion of the right lung is unremarkable. 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 Electronically Signed   By: Yvonne Kendall M.D.   On: 12/30/2021 11:12   DG Wrist 2 Views Right  Result Date:  12/30/2021 CLINICAL DATA:  Right wrist pain for 3 days.  No known injury. EXAM: RIGHT WRIST - 2 VIEW COMPARISON:  None. FINDINGS: There is relative widening of the scapholunate interval measuring approximately 4 mm compared to the lunotriquetral interval measuring 1 mm. Moderate triscaphe joint and thumb carpometacarpal joint space narrowing and peripheral osteophytosis degenerative change. Neutral ulnar variance. No acute fracture or dislocation. IMPRESSION:: IMPRESSION: 1. 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 2. Moderate triscaphe joint and thumb carpometacarpal joint osteoarthritis. Electronically Signed   By: Yvonne Kendall M.D.   On: 12/30/2021 11:11   DG Ankle 2 Views Left  Result Date: 12/30/2021 CLINICAL DATA:  Three days of left ankle pain. EXAM: LEFT ANKLE - 2 VIEW COMPARISON:  None. FINDINGS: Moderate to large calcaneal heel spur. Mild-to-moderate chronic spurring at the Achilles insertion on the calcaneus. The ankle mortise is symmetric and intact. Moderate diffuse ankle soft tissue swelling possibly systemic. There are calcifications overlying the posterior greater than anterior aspects of the distal calf which may be  secondary to chronic stasis/subcutaneous edema. Moderate dorsal midfoot degenerative osteophytes. No acute fracture is seen. No dislocation. IMPRESSION:: IMPRESSION: 1. Moderate to large calcaneal heel spur. 2. Moderate dorsal midfoot osteoarthritis. 3. Soft tissue swelling and calcifications possibly related to chronic stasis/subcutaneous edema. Electronically Signed   By: Yvonne Kendall M.D.   On: 12/30/2021 11:07   DG Ankle 2 Views Right  Result Date: 12/30/2021 CLINICAL DATA:  Right ankle pain for 3 days. EXAM: RIGHT ANKLE - 2 VIEW COMPARISON:  None. FINDINGS: Mild degenerative spurring extending medially from the mid height of the medial malleolus. The ankle mortise is symmetric and intact. Mild distal fibula and adjacent lateral talar degenerative osteophytes. Moderate dorsal talonavicular and navicular-cuneiform degenerative spurring. Mild-to-moderate calcaneal heel spur. Mild chronic spurring at the Achilles insertion on the calcaneus. Mild anterior tibiotalar joint space narrowing. Likely pes planus. Moderate diffuse ankle soft tissue swelling. Calcifications throughout the visualized calf greatest posteriorly and medially. Findings are suggestive of the sequela of chronic stasis/subcutaneous edema. IMPRESSION:: IMPRESSION: 1. Mild-to-moderate calcaneal heel spur. 2. Moderate midfoot osteoarthritis. 3. Moderate diffuse soft tissue swelling with calcifications suggesting the sequela of chronic stasis/subcutaneous edema. Electronically Signed   By: Yvonne Kendall M.D.   On: 12/30/2021 11:09   CT Head Wo Contrast  Result Date: 12/29/2021 CLINICAL DATA:  States this morning she rolled out of bed and could not get up off floor and urinated on self because was too weak to get up. Pt states main problem now is feels weak and legs hurt. 25 EXAM: CT HEAD WITHOUT CONTRAST TECHNIQUE: Contiguous axial images were obtained from the base of the skull through the vertex without intravenous contrast. RADIATION DOSE  REDUCTION: This exam was performed according to the departmental dose-optimization program which includes automated exposure control, adjustment of the mA and/or kV according to patient size and/or use of iterative reconstruction technique. COMPARISON:  None. FINDINGS: Brain: No evidence of acute infarction, hemorrhage, hydrocephalus, extra-axial collection or mass lesion/mass effect. Vascular: No hyperdense vessel or unexpected calcification. Skull: No osseous abnormality. Sinuses/Orbits: Visualized paranasal sinuses are clear. Visualized mastoid sinuses are clear. Visualized orbits demonstrate no focal abnormality. Other: None IMPRESSION: 1. No acute intracranial abnormality. Electronically Signed   By: Kathreen Devoid M.D.   On: 12/29/2021 09:24   MR BRAIN WO CONTRAST  Result Date: 12/29/2021 CLINICAL DATA:  Neuro deficit, acute, stroke suspected. EXAM: MRI HEAD WITHOUT CONTRAST TECHNIQUE: Multiplanar, multiecho pulse sequences of the  brain and surrounding structures were obtained without intravenous contrast. COMPARISON:  Head CT 12/29/2021. FINDINGS: Brain: Cerebral volume is normal. Mild multifocal T2 FLAIR hyperintense signal abnormality within the cerebral white matter, nonspecific but compatible with chronic small vessel ischemic disease. No cortical encephalomalacia is identified. There is no acute infarct. No evidence of an intracranial mass. No chronic intracranial blood products. No extra-axial fluid collection. No midline shift. Vascular: Maintained flow voids within the proximal large arterial vessels. Skull and upper cervical spine: No focal suspicious marrow lesion. Sinuses/Orbits: Visualized orbits show no acute finding. No significant paranasal sinus disease. IMPRESSION: No evidence of acute intracranial abnormality. Mild chronic small vessel ischemic changes within the cerebral white matter. Electronically Signed   By: Kellie Simmering D.O.   On: 12/29/2021 14:25   ECHOCARDIOGRAM COMPLETE  Result  Date: 12/30/2021    ECHOCARDIOGRAM REPORT   Patient Name:   KEELYNN FURGERSON Date of Exam: 12/29/2021 Medical Rec #:  [id number]    Height:       65.0 in Accession #:    [id number]   Weight:       274.0 lb Date of Birth:  1966-05-08     BSA:          2.261 m Patient Age:    8 years     BP:           146/71 mmHg Patient Gender: F            HR:           111 bpm. Exam Location:  [facility] Procedure: 2D Echo, Cardiac Doppler and Color Doppler Indications:     Stroke I63.9  History:         Patient has prior history of Echocardiogram examinations. CHF;                  Risk Factors:Hypertension and Diabetes.  Sonographer:     Alyse Low Roar Referring Phys:  [id number] AGBATA Diagnosing Phys: Donnelly Angelica IMPRESSIONS  1. Left ventricular ejection fraction, by estimation, is 55 to 60%. The left ventricle has normal function. The left ventricle has no regional wall motion abnormalities. The left ventricular internal cavity size was mildly dilated. Indeterminate diastolic filling due to E-A fusion.  2. Right ventricular systolic function was not well visualized. The right ventricular size is not well visualized.  3. The mitral valve is normal in structure. No evidence of mitral valve regurgitation.  4. The aortic valve is normal in structure. Aortic valve regurgitation is not visualized. No aortic stenosis is present. Conclusion(s)/Recommendation(s): TECHNICALLY DIFFICULT STUDY. FINDINGS  Left Ventricle: Left ventricular ejection fraction, by estimation, is 55 to 60%. The left ventricle has normal function. The left ventricle has no regional wall motion abnormalities. The left ventricular internal cavity size was mildly dilated. There is  no left ventricular hypertrophy. Indeterminate diastolic filling due to E-A fusion. Right Ventricle: The right ventricular size is not well visualized. Right ventricular systolic function was not well visualized. Left Atrium: Left atrial size was normal in size. Right Atrium: Right atrial size was  not well visualized. Pericardium: There is no evidence of pericardial effusion. Mitral Valve: The mitral valve is normal in structure. No evidence of mitral valve regurgitation. Tricuspid Valve: The tricuspid valve is not well visualized. Aortic Valve: The aortic valve is normal in structure. Aortic valve regurgitation is not visualized. No aortic stenosis is present. Aortic valve peak gradient measures 10.6 mmHg. Pulmonic Valve: The pulmonic valve was not well visualized. Pulmonic  valve regurgitation is not visualized. No evidence of pulmonic stenosis. Aorta: The aortic root and ascending aorta are structurally normal, with no evidence of dilitation. Venous: The inferior vena cava was not well visualized. IAS/Shunts: The interatrial septum was not well visualized.  LEFT VENTRICLE PLAX 2D LVIDd:         5.22 cm   Diastology LVIDs:         3.72 cm   LV e' medial:    6.20 cm/s LV PW:         1.01 cm   LV E/e' medial:  12.7 LV IVS:        1.06 cm   LV e' lateral:   9.90 cm/s LVOT diam:     2.00 cm   LV E/e' lateral: 8.0 LVOT Area:     3.14 cm  RIGHT VENTRICLE RV Mid diam:    3.29 cm RV S prime:     16.80 cm/s TAPSE (M-mode): 2.1 cm LEFT ATRIUM             Index LA diam:        3.90 cm 1.72 cm/m LA Vol (A2C):   42.9 ml 18.97 ml/m LA Vol (A4C):   39.5 ml 17.47 ml/m LA Biplane Vol: 41.3 ml 18.26 ml/m  AORTIC VALVE                 PULMONIC VALVE AV Area (Vmax): 2.52 cm     PV Vmax:        1.24 m/s AV Vmax:        163.00 cm/s  PV Peak grad:   6.2 mmHg AV Peak Grad:   10.6 mmHg    RVOT Peak grad: 4 mmHg LVOT Vmax:      131.00 cm/s  AORTA Ao Root diam: 2.60 cm Ao Asc diam:  2.90 cm MITRAL VALVE MV Area (PHT): 20.50 cm   SHUNTS MV Decel Time: 37 msec     Systemic Diam: 2.00 cm MV E velocity: 79.00 cm/s MV A Prime:    11.6 cm/s Donnelly Angelica Electronically signed by Donnelly Angelica Signature Date/Time: 12/30/2021/1:05:21 PM    Final       Subjective: Seen and examined at the time of discharge.  Daughter at bedside.  Patient  stable no distress.  In good spirits.  Range of motion and ability to ambulate have improved but not yet at baseline  Discharge Exam: Vitals:   01/03/22 0452 01/03/22 0837  BP: 110/66 137/86  Pulse: 86 99  Resp: 16 16  Temp: 98.1 F (36.7 C) 97.6 F (36.4 C)  SpO2: 96% 99%   Vitals:   01/02/22 1555 01/02/22 1929 01/03/22 0452 01/03/22 0837  BP: (!) 142/61 129/69 110/66 137/86  Pulse: (!) 101 95 86 99  Resp: 17 16 16 16   Temp: 98.6 F (37 C) 98.5 F (36.9 C) 98.1 F (36.7 C) 97.6 F (36.4 C)  TempSrc: Oral   Oral  SpO2: 100% 97% 96% 99%  Weight:      Height:        General: Pt is alert, awake, not in acute distress Cardiovascular: RRR, S1/S2 +, no rubs, no gallops Respiratory: CTA bilaterally, no wheezing, no rhonchi Abdominal: Soft, NT, ND, bowel sounds + Extremities: no edema, no cyanosis    The results of significant diagnostics from this hospitalization (including imaging, microbiology, ancillary and laboratory) are listed below for reference.     Microbiology: Recent Results (from the past 240 hour(s))  Resp Panel by  RT-PCR (Flu A&B, Covid) Nasopharyngeal Swab     Status: None   Collection Time: 12/29/21  8:43 AM   Specimen: Nasopharyngeal Swab; Nasopharyngeal(NP) swabs in vial transport medium  Result Value Ref Range Status   SARS Coronavirus 2 by RT PCR NEGATIVE NEGATIVE Final    Comment: (NOTE) SARS-CoV-2 target nucleic acids are NOT DETECTED.  The SARS-CoV-2 RNA is generally detectable in upper respiratory specimens during the acute phase of infection. The lowest concentration of SARS-CoV-2 viral copies this assay can detect is 138 copies/mL. A negative result does not preclude SARS-Cov-2 infection and should not be used as the sole basis for treatment or other patient management decisions. A negative result may occur with  improper specimen collection/handling, submission of specimen other than nasopharyngeal swab, presence of viral mutation(s) within  the areas targeted by this assay, and inadequate number of viral copies(<138 copies/mL). A negative result must be combined with clinical observations, patient history, and epidemiological information. The expected result is Negative.  Fact Sheet for Patients:  EntrepreneurPulse.com.au  Fact Sheet for Healthcare Providers:  IncredibleEmployment.be  This test is no t yet approved or cleared by the [place] FDA and  has been authorized for detection and/or diagnosis of SARS-CoV-2 by FDA under an Emergency Use Authorization (EUA). This EUA will remain  in effect (meaning this test can be used) for the duration of the COVID-19 declaration under Section 564(b)(1) of the Act, 21 U.S.C.section 360bbb-3(b)(1), unless the authorization is terminated  or revoked sooner.       Influenza A by PCR NEGATIVE NEGATIVE Final   Influenza B by PCR NEGATIVE NEGATIVE Final    Comment: (NOTE) The Xpert Xpress SARS-CoV-2/FLU/RSV plus assay is intended as an aid in the diagnosis of influenza from Nasopharyngeal swab specimens and should not be used as a sole basis for treatment. Nasal washings and aspirates are unacceptable for Xpert Xpress SARS-CoV-2/FLU/RSV testing.  Fact Sheet for Patients: EntrepreneurPulse.com.au  Fact Sheet for Healthcare Providers: IncredibleEmployment.be  This test is not yet approved or cleared by the [place] FDA and has been authorized for detection and/or diagnosis of SARS-CoV-2 by FDA under an Emergency Use Authorization (EUA). This EUA will remain in effect (meaning this test can be used) for the duration of the COVID-19 declaration under Section 564(b)(1) of the Act, 21 U.S.C. section 360bbb-3(b)(1), unless the authorization is terminated or revoked.  Performed at [facility], [address] [place], [place] [address]   Urine Culture     Status: Abnormal   Collection  Time: 12/29/21  9:40 AM   Specimen: Urine, Clean Catch  Result Value Ref Range Status   Specimen Description   Final    URINE, CLEAN CATCH Performed at [facility], [address] [place], [place] [address]    Special Requests   Final    NONE Performed at [facility], [place], [place] [address]    Culture (A)  Final    >=100,000 COLONIES/mL PROTEUS MIRABILIS 60,000 COLONIES/mL KLEBSIELLA PNEUMONIAE    Report Status 01/02/2022 FINAL  Final   Organism ID, Bacteria PROTEUS MIRABILIS (A)  Final   Organism ID, Bacteria KLEBSIELLA PNEUMONIAE (A)  Final      Susceptibility   Klebsiella pneumoniae - MIC*    AMPICILLIN >=32 RESISTANT Resistant     CEFAZOLIN <=4 SENSITIVE Sensitive     CEFEPIME <=0.12 SENSITIVE Sensitive     CEFTRIAXONE <=0.25 SENSITIVE Sensitive     CIPROFLOXACIN <=0.25 SENSITIVE Sensitive  GENTAMICIN <=1 SENSITIVE Sensitive     IMIPENEM <=0.25 SENSITIVE Sensitive     NITROFURANTOIN <=16 SENSITIVE Sensitive     TRIMETH/SULFA <=20 SENSITIVE Sensitive     AMPICILLIN/SULBACTAM 8 SENSITIVE Sensitive     PIP/TAZO <=4 SENSITIVE Sensitive     * 60,000 COLONIES/mL KLEBSIELLA PNEUMONIAE   Proteus mirabilis - MIC*    AMPICILLIN 4 SENSITIVE Sensitive     CEFAZOLIN 8 SENSITIVE Sensitive     CEFEPIME <=0.12 SENSITIVE Sensitive     CEFTRIAXONE <=0.25 SENSITIVE Sensitive     CIPROFLOXACIN <=0.25 SENSITIVE Sensitive     GENTAMICIN <=1 SENSITIVE Sensitive     IMIPENEM 2 SENSITIVE Sensitive     NITROFURANTOIN 128 RESISTANT Resistant     TRIMETH/SULFA <=20 SENSITIVE Sensitive     AMPICILLIN/SULBACTAM <=2 SENSITIVE Sensitive     PIP/TAZO <=4 SENSITIVE Sensitive     * >=100,000 COLONIES/mL PROTEUS MIRABILIS  CULTURE, BLOOD (ROUTINE X 2) w Reflex to ID Panel     Status: Abnormal   Collection Time: 12/30/21 10:34 AM   Specimen: BLOOD  Result Value Ref Range Status   Specimen Description   Final    BLOOD RIGHT Operating Room Services Performed at [facility], [address], [place], [place] [address]    Special Requests   Final    BOTTLES DRAWN AEROBIC AND ANAEROBIC Blood Culture adequate volume Performed at [facility], [address] [place], [place] [address]    Culture  Setup Time   Final    GRAM POSITIVE COCCI AEROBIC BOTTLE ONLY CRITICAL RESULT CALLED TO, READ BACK BY AND VERIFIED WITH: [facility] MITCHELL 12/31/21 1142 MW    Culture (A)  Final    STAPHYLOCOCCUS EPIDERMIDIS THE SIGNIFICANCE OF ISOLATING THIS ORGANISM FROM A SINGLE SET OF BLOOD CULTURES WHEN MULTIPLE SETS ARE DRAWN IS UNCERTAIN. PLEASE NOTIFY THE MICROBIOLOGY DEPARTMENT WITHIN ONE WEEK IF SPECIATION AND SENSITIVITIES ARE REQUIRED. Performed at [facility], [place] [address], [place], [place] [address]    Report Status 01/02/2022 FINAL  Final  Blood Culture ID Panel (Reflexed)     Status: Abnormal   Collection Time: 12/30/21 10:34 AM  Result Value Ref Range Status   Enterococcus faecalis NOT DETECTED NOT DETECTED Final   Enterococcus Faecium NOT DETECTED NOT DETECTED Final   Listeria monocytogenes NOT DETECTED NOT DETECTED Final   Staphylococcus species DETECTED (A) NOT DETECTED Final    Comment: CRITICAL RESULT CALLED TO, READ BACK BY AND VERIFIED WITH: DEVAN MITCHELL 12/31/21 1143 MW    Staphylococcus aureus (BCID) NOT DETECTED NOT DETECTED Final   Staphylococcus epidermidis DETECTED (A) NOT DETECTED Final    Comment: CRITICAL RESULT CALLED TO, READ BACK BY AND VERIFIED WITH: DEVAN MITCHELL 12/31/21 1143 MW    Staphylococcus lugdunensis NOT DETECTED NOT DETECTED Final   Streptococcus species NOT DETECTED NOT DETECTED Final   Streptococcus agalactiae NOT DETECTED NOT DETECTED Final   Streptococcus pneumoniae NOT DETECTED NOT DETECTED Final   Streptococcus pyogenes NOT DETECTED NOT DETECTED Final   A.calcoaceticus-baumannii NOT DETECTED NOT DETECTED Final   Bacteroides fragilis NOT DETECTED NOT DETECTED Final   Enterobacterales NOT DETECTED  NOT DETECTED Final   Enterobacter cloacae complex NOT DETECTED NOT DETECTED Final   Escherichia coli NOT DETECTED NOT DETECTED Final   Klebsiella aerogenes NOT DETECTED NOT DETECTED Final   Klebsiella oxytoca NOT DETECTED NOT DETECTED Final   Klebsiella pneumoniae NOT DETECTED NOT DETECTED Final   Proteus species NOT DETECTED NOT DETECTED Final   Salmonella species NOT  DETECTED NOT DETECTED Final   Serratia marcescens NOT DETECTED NOT DETECTED Final   Haemophilus influenzae NOT DETECTED NOT DETECTED Final   Neisseria meningitidis NOT DETECTED NOT DETECTED Final   Pseudomonas aeruginosa NOT DETECTED NOT DETECTED Final   Stenotrophomonas maltophilia NOT DETECTED NOT DETECTED Final   Candida albicans NOT DETECTED NOT DETECTED Final   Candida auris NOT DETECTED NOT DETECTED Final   Candida glabrata NOT DETECTED NOT DETECTED Final   Candida krusei NOT DETECTED NOT DETECTED Final   Candida parapsilosis NOT DETECTED NOT DETECTED Final   Candida tropicalis NOT DETECTED NOT DETECTED Final   Cryptococcus neoformans/gattii NOT DETECTED NOT DETECTED Final   Methicillin resistance mecA/C NOT DETECTED NOT DETECTED Final    Comment: Performed at [facility], [address] [place], [place] [address]  CULTURE, BLOOD (ROUTINE X 2) w Reflex to ID Panel     Status: None (Preliminary result)   Collection Time: 12/30/21 10:41 AM   Specimen: BLOOD  Result Value Ref Range Status   Specimen Description BLOOD LEFT AC  Final   Special Requests   Final    BOTTLES DRAWN AEROBIC AND ANAEROBIC Blood Culture adequate volume   Culture   Final    NO GROWTH 4 DAYS Performed at [facility], [address], [place], [place] [address]    Report Status PENDING  Incomplete  CULTURE, BLOOD (ROUTINE X 2) w Reflex to ID Panel     Status: None (Preliminary result)   Collection Time: 12/31/21  1:25 PM   Specimen: BLOOD  Result Value Ref Range Status   Specimen Description BLOOD LEFT AC  Final    Special Requests   Final    BOTTLES DRAWN AEROBIC ONLY Blood Culture results may not be optimal due to an inadequate volume of blood received in culture bottles   Culture   Final    NO GROWTH 3 DAYS Performed at [facility], [address] [place], [place] [address]    Report Status PENDING  Incomplete  CULTURE, BLOOD (ROUTINE X 2) w Reflex to ID Panel     Status: None (Preliminary result)   Collection Time: 12/31/21  1:26 PM   Specimen: BLOOD  Result Value Ref Range Status   Specimen Description BLOOD RIGHT [facility]  Final   Special Requests   Final    BOTTLES DRAWN AEROBIC AND ANAEROBIC Blood Culture adequate volume   Culture   Final    NO GROWTH 3 DAYS Performed at [facility], [address], [place], [place] [address]    Report Status PENDING  Incomplete  Chlamydia/NGC rt PCR ([place] only)     Status: None   Collection Time: 12/31/21  3:43 PM   Specimen: Urine  Result Value Ref Range Status   Specimen source GC/Chlam ENDOCERVICAL  Final   Chlamydia Tr NOT DETECTED NOT DETECTED Final   N gonorrhoeae NOT DETECTED NOT DETECTED Final    Comment: (NOTE) This CT/NG assay has not been evaluated in patients with a history of  hysterectomy. Performed at [facility], [address] [place],  [address]      Labs: BNP (last 3 results) No results for input(s): BNP in the last 8760 hours. Basic Metabolic Panel: Recent Labs  Lab 12/29/21 0843 12/31/21 0423 01/01/22 0451 01/02/22 0451 01/03/22 0411  NA 138 138 137 139 139  K 4.2 4.2 4.2 4.5 4.2  CL 102 102 103 102 106  CO2 23 27 25 25 26   GLUCOSE  176* 128* 134* 177* 170*  BUN 22* 21* 25* 25* 34*  CREATININE 1.41* 1.41* 1.48* 1.45* 1.43*  CALCIUM 9.0 9.2 9.4 9.7 9.6   Liver Function Tests: Recent Labs  Lab 12/29/21 0843  AST 18  ALT 13  ALKPHOS 68  BILITOT 0.8  PROT 7.9  ALBUMIN 3.5   No results for input(s): LIPASE, AMYLASE in the last 168 hours. No results for input(s): AMMONIA  in the last 168 hours. CBC: Recent Labs  Lab 12/29/21 0843 12/31/21 0423 01/03/22 0411  WBC 14.8* 15.3* 16.3*  NEUTROABS 10.2*  --   --   HGB 8.6* 8.5* 8.2*  HCT 29.0* 28.3* 28.5*  MCV 87.6 85.2 86.9  PLT 383 407* 526*   Cardiac Enzymes: Recent Labs  Lab 12/29/21 0843  CKTOTAL 51   BNP: Invalid input(s): POCBNP CBG: Recent Labs  Lab 01/02/22 0757 01/02/22 1133 01/02/22 1648 01/02/22 2117 01/03/22 0834  GLUCAP 125* 131* 188* 234* 119*   D-Dimer No results for input(s): DDIMER in the last 72 hours. Hgb A1c No results for input(s): HGBA1C in the last 72 hours. Lipid Profile No results for input(s): CHOL, HDL, LDLCALC, TRIG, CHOLHDL, LDLDIRECT in the last 72 hours. Thyroid function studies No results for input(s): TSH, T4TOTAL, T3FREE, THYROIDAB in the last 72 hours.  Invalid input(s): FREET3 Anemia work up No results for input(s): VITAMINB12, FOLATE, FERRITIN, TIBC, IRON, RETICCTPCT in the last 72 hours. Urinalysis    Component Value Date/Time   COLORURINE YELLOW (A) 12/29/2021 0940   APPEARANCEUR HAZY (A) 12/29/2021 0940   LABSPEC 1.016 12/29/2021 0940   PHURINE 9.0 (H) 12/29/2021 0940   GLUCOSEU NEGATIVE 12/29/2021 0940   HGBUR NEGATIVE 12/29/2021 0940   BILIRUBINUR NEGATIVE 12/29/2021 0940   KETONESUR NEGATIVE 12/29/2021 0940   PROTEINUR 30 (A) 12/29/2021 0940   NITRITE NEGATIVE 12/29/2021 0940   LEUKOCYTESUR NEGATIVE 12/29/2021 0940   Sepsis Labs Invalid input(s): PROCALCITONIN,  WBC,  LACTICIDVEN Microbiology Recent Results (from the past 240 hour(s))  Resp Panel by RT-PCR (Flu A&B, Covid) Nasopharyngeal Swab     Status: None   Collection Time: 12/29/21  8:43 AM   Specimen: Nasopharyngeal Swab; Nasopharyngeal(NP) swabs in vial transport medium  Result Value Ref Range Status   SARS Coronavirus 2 by RT PCR NEGATIVE NEGATIVE Final    Comment: (NOTE) SARS-CoV-2 target nucleic acids are NOT DETECTED.  The SARS-CoV-2 RNA is generally detectable in  upper respiratory specimens during the acute phase of infection. The lowest concentration of SARS-CoV-2 viral copies this assay can detect is 138 copies/mL. A negative result does not preclude SARS-Cov-2 infection and should not be used as the sole basis for treatment or other patient management decisions. A negative result may occur with  improper specimen collection/handling, submission of specimen other than nasopharyngeal swab, presence of viral mutation(s) within the areas targeted by this assay, and inadequate number of viral copies(<138 copies/mL). A negative result must be combined with clinical observations, patient history, and epidemiological information. The expected result is Negative.  Fact Sheet for Patients:  EntrepreneurPulse.com.au  Fact Sheet for Healthcare Providers:  IncredibleEmployment.be  This test is no t yet approved or cleared by the [place] FDA and  has been authorized for detection and/or diagnosis of SARS-CoV-2 by FDA under an Emergency Use Authorization (EUA). This EUA will remain  in effect (meaning this test can be used) for the duration of the COVID-19 declaration under Section 564(b)(1) of the Act, 21 U.S.C.section 360bbb-3(b)(1), unless the authorization is terminated  or  revoked sooner.       Influenza A by PCR NEGATIVE NEGATIVE Final   Influenza B by PCR NEGATIVE NEGATIVE Final    Comment: (NOTE) The Xpert Xpress SARS-CoV-2/FLU/RSV plus assay is intended as an aid in the diagnosis of influenza from Nasopharyngeal swab specimens and should not be used as a sole basis for treatment. Nasal washings and aspirates are unacceptable for Xpert Xpress SARS-CoV-2/FLU/RSV testing.  Fact Sheet for Patients: EntrepreneurPulse.com.au  Fact Sheet for Healthcare Providers: IncredibleEmployment.be  This test is not yet approved or cleared by the [place] FDA and has been  authorized for detection and/or diagnosis of SARS-CoV-2 by FDA under an Emergency Use Authorization (EUA). This EUA will remain in effect (meaning this test can be used) for the duration of the COVID-19 declaration under Section 564(b)(1) of the Act, 21 U.S.C. section 360bbb-3(b)(1), unless the authorization is terminated or revoked.  Performed at [facility], [address] [place], [place] [address]   Urine Culture     Status: Abnormal   Collection Time: 12/29/21  9:40 AM   Specimen: Urine, Clean Catch  Result Value Ref Range Status   Specimen Description   Final    URINE, CLEAN CATCH Performed at [facility], [address] [place], [place] [address]    Special Requests   Final    NONE Performed at [facility], [address] [place], [place] [address]    Culture (A)  Final    >=100,000 COLONIES/mL PROTEUS MIRABILIS 60,000 COLONIES/mL KLEBSIELLA PNEUMONIAE    Report Status 01/02/2022 FINAL  Final   Organism ID, Bacteria PROTEUS MIRABILIS (A)  Final   Organism ID, Bacteria KLEBSIELLA PNEUMONIAE (A)  Final      Susceptibility   Klebsiella pneumoniae - MIC*    AMPICILLIN >=32 RESISTANT Resistant     CEFAZOLIN <=4 SENSITIVE Sensitive     CEFEPIME <=0.12 SENSITIVE Sensitive     CEFTRIAXONE <=0.25 SENSITIVE Sensitive     CIPROFLOXACIN <=0.25 SENSITIVE Sensitive     GENTAMICIN <=1 SENSITIVE Sensitive     IMIPENEM <=0.25 SENSITIVE Sensitive     NITROFURANTOIN <=16 SENSITIVE Sensitive     TRIMETH/SULFA <=20 SENSITIVE Sensitive     AMPICILLIN/SULBACTAM 8 SENSITIVE Sensitive     PIP/TAZO <=4 SENSITIVE Sensitive     * 60,000 COLONIES/mL KLEBSIELLA PNEUMONIAE   Proteus mirabilis - MIC*    AMPICILLIN 4 SENSITIVE Sensitive     CEFAZOLIN 8 SENSITIVE Sensitive     CEFEPIME <=0.12 SENSITIVE Sensitive     CEFTRIAXONE <=0.25 SENSITIVE Sensitive     CIPROFLOXACIN <=0.25 SENSITIVE Sensitive     GENTAMICIN <=1 SENSITIVE Sensitive     IMIPENEM 2  SENSITIVE Sensitive     NITROFURANTOIN 128 RESISTANT Resistant     TRIMETH/SULFA <=20 SENSITIVE Sensitive     AMPICILLIN/SULBACTAM <=2 SENSITIVE Sensitive     PIP/TAZO <=4 SENSITIVE Sensitive     * >=100,000 COLONIES/mL PROTEUS MIRABILIS  CULTURE, BLOOD (ROUTINE X 2) w Reflex to ID Panel     Status: Abnormal   Collection Time: 12/30/21 10:34 AM   Specimen: BLOOD  Result Value Ref Range Status   Specimen Description   Final    BLOOD RIGHT [facility] Performed at [facility], [address] [place], [place] [address]    Special Requests   Final    BOTTLES DRAWN AEROBIC AND ANAEROBIC Blood Culture adequate volume Performed at [facility], [address] [place],  [address]    Culture  Setup  Time   Final    GRAM POSITIVE COCCI AEROBIC BOTTLE ONLY CRITICAL RESULT CALLED TO, READ BACK BY AND VERIFIED WITH: [facility] MITCHELL 12/31/21 1142 MW    Culture (A)  Final    STAPHYLOCOCCUS EPIDERMIDIS THE SIGNIFICANCE OF ISOLATING THIS ORGANISM FROM A SINGLE SET OF BLOOD CULTURES WHEN MULTIPLE SETS ARE DRAWN IS UNCERTAIN. PLEASE NOTIFY THE MICROBIOLOGY DEPARTMENT WITHIN ONE WEEK IF SPECIATION AND SENSITIVITIES ARE REQUIRED. Performed at [facility], [place] [address], [place], [place] [address]    Report Status 01/02/2022 FINAL  Final  Blood Culture ID Panel (Reflexed)     Status: Abnormal   Collection Time: 12/30/21 10:34 AM  Result Value Ref Range Status   Enterococcus faecalis NOT DETECTED NOT DETECTED Final   Enterococcus Faecium NOT DETECTED NOT DETECTED Final   Listeria monocytogenes NOT DETECTED NOT DETECTED Final   Staphylococcus species DETECTED (A) NOT DETECTED Final    Comment: CRITICAL RESULT CALLED TO, READ BACK BY AND VERIFIED WITH: DEVAN MITCHELL 12/31/21 1143 MW    Staphylococcus aureus (BCID) NOT DETECTED NOT DETECTED Final   Staphylococcus epidermidis DETECTED (A) NOT DETECTED Final    Comment: CRITICAL RESULT CALLED TO, READ BACK BY AND VERIFIED  WITH: DEVAN MITCHELL 12/31/21 1143 MW    Staphylococcus lugdunensis NOT DETECTED NOT DETECTED Final   Streptococcus species NOT DETECTED NOT DETECTED Final   Streptococcus agalactiae NOT DETECTED NOT DETECTED Final   Streptococcus pneumoniae NOT DETECTED NOT DETECTED Final   Streptococcus pyogenes NOT DETECTED NOT DETECTED Final   A.calcoaceticus-baumannii NOT DETECTED NOT DETECTED Final   Bacteroides fragilis NOT DETECTED NOT DETECTED Final   Enterobacterales NOT DETECTED NOT DETECTED Final   Enterobacter cloacae complex NOT DETECTED NOT DETECTED Final   Escherichia coli NOT DETECTED NOT DETECTED Final   Klebsiella aerogenes NOT DETECTED NOT DETECTED Final   Klebsiella oxytoca NOT DETECTED NOT DETECTED Final   Klebsiella pneumoniae NOT DETECTED NOT DETECTED Final   Proteus species NOT DETECTED NOT DETECTED Final   Salmonella species NOT DETECTED NOT DETECTED Final   Serratia marcescens NOT DETECTED NOT DETECTED Final   Haemophilus influenzae NOT DETECTED NOT DETECTED Final   Neisseria meningitidis NOT DETECTED NOT DETECTED Final   Pseudomonas aeruginosa NOT DETECTED NOT DETECTED Final   Stenotrophomonas maltophilia NOT DETECTED NOT DETECTED Final   Candida albicans NOT DETECTED NOT DETECTED Final   Candida auris NOT DETECTED NOT DETECTED Final   Candida glabrata NOT DETECTED NOT DETECTED Final   Candida krusei NOT DETECTED NOT DETECTED Final   Candida parapsilosis NOT DETECTED NOT DETECTED Final   Candida tropicalis NOT DETECTED NOT DETECTED Final   Cryptococcus neoformans/gattii NOT DETECTED NOT DETECTED Final   Methicillin resistance mecA/C NOT DETECTED NOT DETECTED Final    Comment: Performed at [facility], [address] [place], [place] [address]  CULTURE, BLOOD (ROUTINE X 2) w Reflex to ID Panel     Status: None (Preliminary result)   Collection Time: 12/30/21 10:41 AM   Specimen: BLOOD  Result Value Ref Range Status   Specimen Description BLOOD LEFT [facility]  Final    Special Requests   Final    BOTTLES DRAWN AEROBIC AND ANAEROBIC Blood Culture adequate volume   Culture   Final    NO GROWTH 4 DAYS Performed at [facility], [address] [place], [place] [address]    Report Status PENDING  Incomplete  CULTURE, BLOOD (ROUTINE X 2) w Reflex to ID Panel     Status: None (  Preliminary result)   Collection Time: 12/31/21  1:25 PM   Specimen: BLOOD  Result Value Ref Range Status   Specimen Description BLOOD LEFT AC  Final   Special Requests   Final    BOTTLES DRAWN AEROBIC ONLY Blood Culture results may not be optimal due to an inadequate volume of blood received in culture bottles   Culture   Final    NO GROWTH 3 DAYS Performed at [facility], [address] [place], [place] [address]    Report Status PENDING  Incomplete  CULTURE, BLOOD (ROUTINE X 2) w Reflex to ID Panel     Status: None (Preliminary result)   Collection Time: 12/31/21  1:26 PM   Specimen: BLOOD  Result Value Ref Range Status   Specimen Description BLOOD RIGHT [facility]  Final   Special Requests   Final    BOTTLES DRAWN AEROBIC AND ANAEROBIC Blood Culture adequate volume   Culture   Final    NO GROWTH 3 DAYS Performed at [facility], [address], [place], [place] [address]    Report Status PENDING  Incomplete  Chlamydia/NGC rt PCR ([place] only)     Status: None   Collection Time: 12/31/21  3:43 PM   Specimen: Urine  Result Value Ref Range Status   Specimen source GC/Chlam ENDOCERVICAL  Final   Chlamydia Tr NOT DETECTED NOT DETECTED Final   N gonorrhoeae NOT DETECTED NOT DETECTED Final    Comment: (NOTE) This CT/NG assay has not been evaluated in patients with a history of  hysterectomy. Performed at [facility], [address], [place], [place] [address]      Time coordinating discharge: Over 30 minutes  SIGNED:   Sidney Ace, MD  [facility] 01/03/2022, 11:58 AM Pager   If 7PM-7AM, please contact  night-coverage

## 2022-01-03 NOTE — Progress Notes (Signed)
Inpatient Diabetes Program Recommendations ? ?AACE/ADA: New Consensus Statement on Inpatient Glycemic Control (2015) ? ?Target Ranges:  Prepandial:   less than 140 mg/dL ?     Peak postprandial:   less than 180 mg/dL (1-2 hours) ?     Critically ill patients:  140 - 180 mg/dL  ? ?Lab Results  ?Component Value Date  ? GLUCAP 116 (H) 01/03/2022  ? HGBA1C 11.8 (H) 12/30/2021  ? ? ?Spoke with pt at bedside regarding A1c of 11.8% and glucose control at home. Noted [employer] sweet tea at bedside. Daughter was also sitting at bedside offering information on patients dietary habits. Discussed A1c. Reviewed glucose and A1c goals. Encouraged pt to change her beverage choices at home. Discussed common complications of uncontrolled glucose levels. ? ?Thanks, ? ?Terry Deem RN, MSN, BC-ADM ?Inpatient Diabetes Coordinator ?Team Pager [phone] (8a-5p) ? ? ? ?

## 2022-01-03 NOTE — TOC Transition Note (Signed)
Transition of Care (TOC) - CM/SW Discharge Note ? ? ?Patient Details  ?Name: Nemiah Bubar ?MRN: [id number] ?Date of Birth: 17-Feb-1966 ? ?Transition of Care (TOC) CM/SW Contact:  ?Beverly Sessions, RN ?Phone Number: ?01/03/2022, 1:32 PM ? ? ?Clinical Narrative:    ? ?[facility] home [facility] is unable to accept patient back ?[facility], [facility], [facility], [facility] unable to accept ? ?Met with patient and sister at bedside ?They confirm plan is to return home.  Not SNF.  They are aware patient will not receive home health services. They Decline DME ? ?EMS packet on chart.  ?EMS transport called  ? ?  ?Barriers to Discharge: Continued Medical Work up ? ? ?Patient Goals and CMS Choice ?  ?  ?  ? ?Discharge Placement ?  ?           ?  ?  ?  ?  ? ?Discharge Plan and Services ?  ?Discharge Planning Services: CM Consult ?           ?  ?  ?  ?  ?  ?  ?  ?  ?  ?  ? ?Social Determinants of Health (SDOH) Interventions ?  ? ? ?Readmission Risk Interventions ?No flowsheet data found. ? ? ? ? ?

## 2022-01-03 NOTE — Progress Notes (Signed)
Occupational Therapy Treatment ?Patient Details ?Name: Terry Noble ?MRN: [id number] ?DOB: 1966/10/15 ?Today's Date: 01/03/2022 ? ? ?History of present illness Terry Noble is a 56 y.o. female with medical history significant for morbid obesity, hypertension, diabetes mellitus, sleep apnea and CHF who presents to the ER for evaluation of a 2-day history of left-sided weakness. She denies having any difficulty swallowing or slurred speech.  She complains of blurred vision but denies having any headache, no dizziness, no lightheadedness, no nausea or vomiting.  She reports that after 2 days of weakness, she rolled out of bed and landed on the floor and could not get up due to the weakness, so called EMS. She denies having any chest pain, no shortness of breath, no changes in her bowel habits, no back pain, no fever, no cough, no chills, no urinary symptoms. Initial CT scan of the head without contrast does not show an acute bleed. ?  ?OT comments ? Terry Noble was seen for OT treatment on this date. Upon arrival to room pt seated EOB, family at bedside. Pt refuses mobility attempts citing fatigue/pain from recent unsuccessful attempts at standing with PT. Pt demonstrates normal static sitting balance, SUPERVISION seated dynamic tasks. Family and pt engaged in discussion on home routines/modifications and DME recs. Family states having all needed equipment. Educated on bed rails, hospital bed, BSC, and w/c. Pt states plan to use BSC then states unable to stand this date, will use depends/chux pads in bed upon d/c. Educated on adaptive dressing strategies. Reviewed HEP with good recall of exercises. Left with staff and family in room, all needs in reach. Pt making progress toward goals. Pt continues to benefit from skilled OT services to maximize return to PLOF. Will continue to follow POC. Discharge recommendation remains appropriate.  ?  ? ?Recommendations for follow up therapy are one component of a multi-disciplinary  discharge planning process, led by the attending physician.  Recommendations may be updated based on patient status, additional functional criteria and insurance authorization. ?   ?Follow Up Recommendations ? Skilled nursing-short term rehab (<3 hours/day)  ?  ?Assistance Recommended at Discharge Frequent or constant Supervision/Assistance  ?Patient can return home with the following ? A lot of help with bathing/dressing/bathroom;Assistance with cooking/housework;Assist for transportation;Help with stairs or ramp for entrance;Two people to help with walking and/or transfers ?  ?Equipment Recommendations ? Other (comment) (defer)  ?  ?Recommendations for Other Services   ? ?  ?Precautions / Restrictions Precautions ?Precautions: Fall ?Restrictions ?Weight Bearing Restrictions: No  ? ? ?  ? ?Mobility Bed Mobility ?  ?  ?  ?  ?  ?  ?  ?  ?  ? ?Transfers ?  ?  ?  ?  ?  ?  ?  ?  ?  ?General transfer comment: pt defers citing fatigue following PT session ?  ?  ?Balance Overall balance assessment: Needs assistance ?Sitting-balance support: Feet supported, No upper extremity supported ?Sitting balance-Leahy Scale: Normal ?  ?  ?  ?  ?  ?  ?  ?  ?  ?  ?  ?  ?  ?  ?  ?  ?   ? ?ADL either performed or assessed with clinical judgement  ? ?ADL Overall ADL's : Needs assistance/impaired ?  ?  ?  ?  ?  ?  ?  ?  ?  ?  ?  ?  ?  ?  ?  ?  ?  ?  ?  ?General  ADL Comments: Normal sitting balance, SUPERVISION seated dynamic tasks ?  ? ?Extremity/Trunk Assessment   ?  ?  ?  ?  ?  ? ? ?Cognition Arousal/Alertness: Awake/alert ?Behavior During Therapy: [facility] for tasks assessed/performed ?Overall Cognitive Status: Within Functional Limits for tasks assessed ?  ?  ?  ?  ?  ?  ?  ?  ?  ?  ?  ?  ?  ?  ?  ?  ?  ?  ?  ?   ?Exercises Other Exercises ?Other Exercises: Discussion with pt/family re: home/routines modifications and DME recs ? ?  ?Shoulder Instructions   ? ? ?  ?General Comments    ? ? ?Pertinent Vitals/ Pain       Pain Assessment ?Pain  Assessment: Faces ?Faces Pain Scale: Hurts even more ?Pain Location: Left hip/L knee ?Pain Descriptors / Indicators: Grimacing, Guarding, Pounding, Moaning ?Pain Intervention(s): Limited activity within patient's tolerance, Repositioned ? ? ?Frequency ? Min 2X/week  ? ? ? ? ?  ?Progress Toward Goals ? ?OT Goals(current goals can now be found in the care plan section) ? Progress towards OT goals: Progressing toward goals ? ?Acute Rehab OT Goals ?Patient Stated Goal: to walk and go home ?OT Goal Formulation: With patient ?Time For Goal Achievement: 01/13/22 ?Potential to Achieve Goals: Good ?ADL Goals ?Pt Will Perform Grooming: with modified independence;sitting ?Pt Will Transfer to Toilet: with modified independence;ambulating;regular height toilet;stand pivot transfer ?Pt/caregiver will Perform Home Exercise Program: Increased ROM;Increased strength;Independently ?Additional ADL Goal #1: Pt will be able to identify/demonstrate 2+ techniques for managing diabetes.  ?Plan Discharge plan remains appropriate;Frequency remains appropriate   ? ?Co-evaluation ? ? ?   ?  ?  ?  ?  ? ?  ?AM-PAC OT "6 Clicks" Daily Activity     ?Outcome Measure ? ? Help from another person eating meals?: None ?Help from another person taking care of personal grooming?: A Little ?Help from another person toileting, which includes using toliet, bedpan, or urinal?: A Lot ?Help from another person bathing (including washing, rinsing, drying)?: A Lot ?Help from another person to put on and taking off regular upper body clothing?: A Little ?Help from another person to put on and taking off regular lower body clothing?: A Lot ?6 Click Score: 16 ? ?  ?End of Session   ? ?OT Visit Diagnosis: Muscle weakness (generalized) (M62.81);Pain;Unsteadiness on feet (R26.81) ?Pain - Right/Left: Left ?Pain - part of body: Knee ?  ?Activity Tolerance Patient tolerated treatment well;Patient limited by pain ?  ?Patient Left with call bell/phone within reach;with  family/visitor present (seated EOB) ?  ?Nurse Communication   ?  ? ?   ? ?Time: [id number] ?OT Time Calculation (min): 13 min ? ?Charges: OT General Charges ?$OT Visit: 1 Visit ?OT Treatments ?$Self Care/Home Management : 8-22 mins ? ?Dessie Coma, M.S. OTR/L  ?01/03/22, 10:58 AM  ?ascom [phone] ? ?

## 2022-01-04 LAB — CULTURE, BLOOD (ROUTINE X 2)
Culture: NO GROWTH
Special Requests: ADEQUATE

## 2022-01-05 LAB — CULTURE, BLOOD (ROUTINE X 2)
Culture: NO GROWTH
Culture: NO GROWTH
Special Requests: ADEQUATE

## 2023-02-20 IMAGING — DX DG SHOULDER 2+V*R*
2 series · 3 of 3 positions shown · non-contrast
Comparison: None available

CLINICAL DATA: Right shoulder pain.  Known injury.

EXAM:
RIGHT SHOULDER - 2+ VIEW

[shoulder ap]
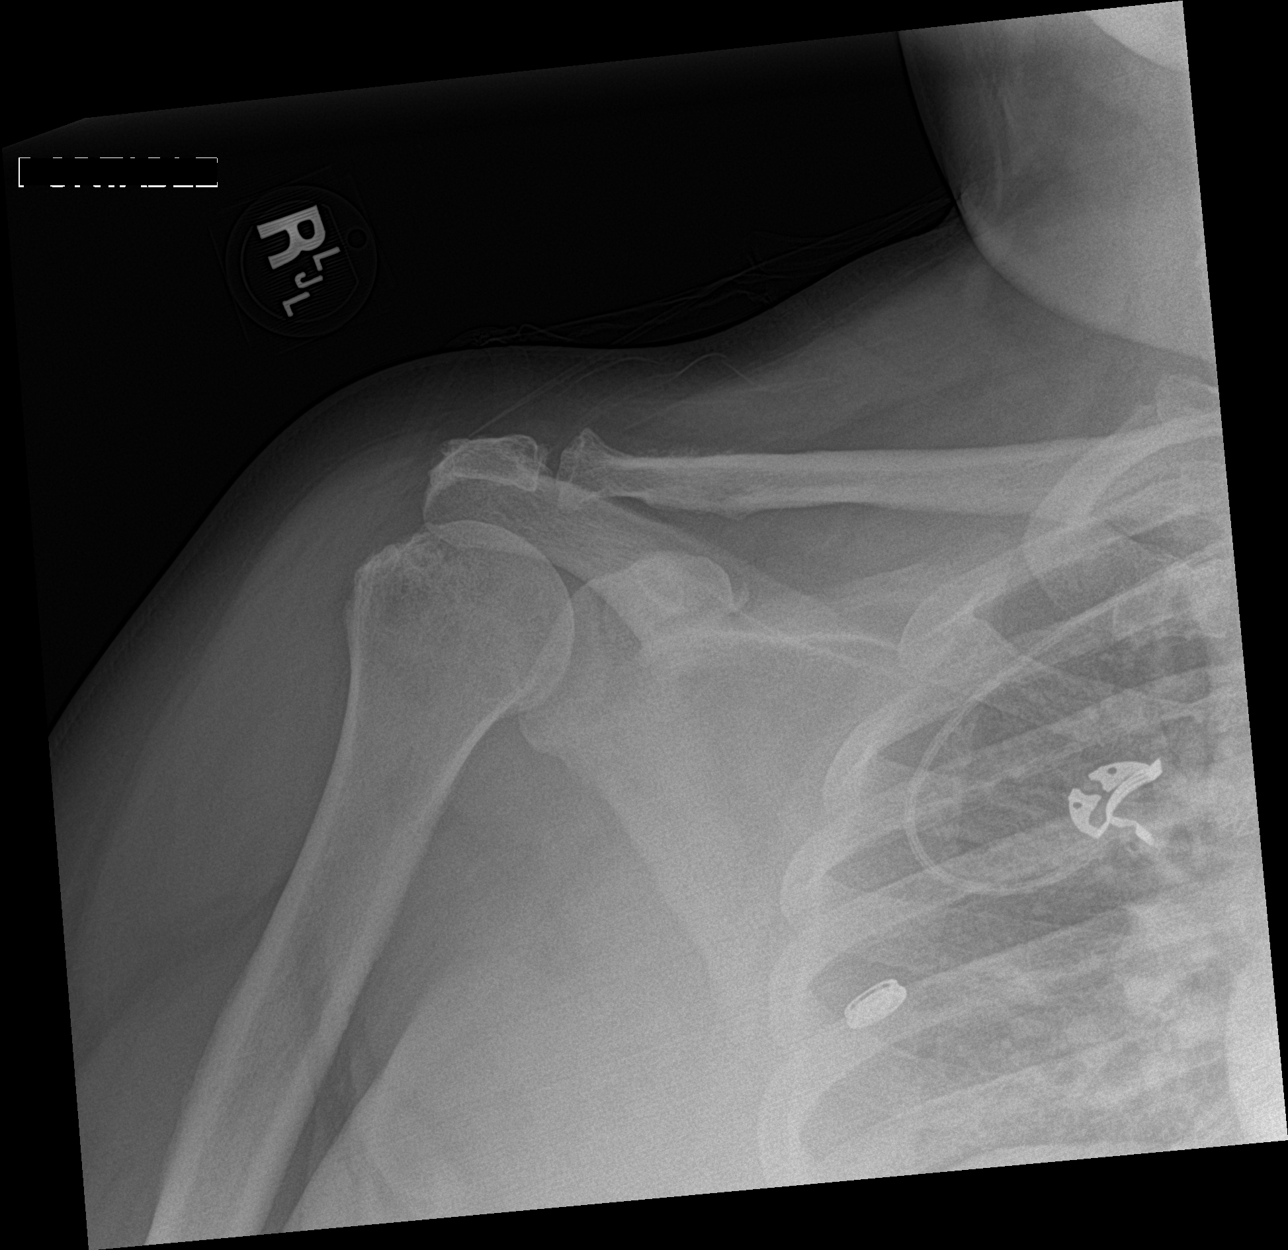

[Series 4: shoulder obl · 0.14mm/px · 2 of 2 slices shown]
[im 1/2]
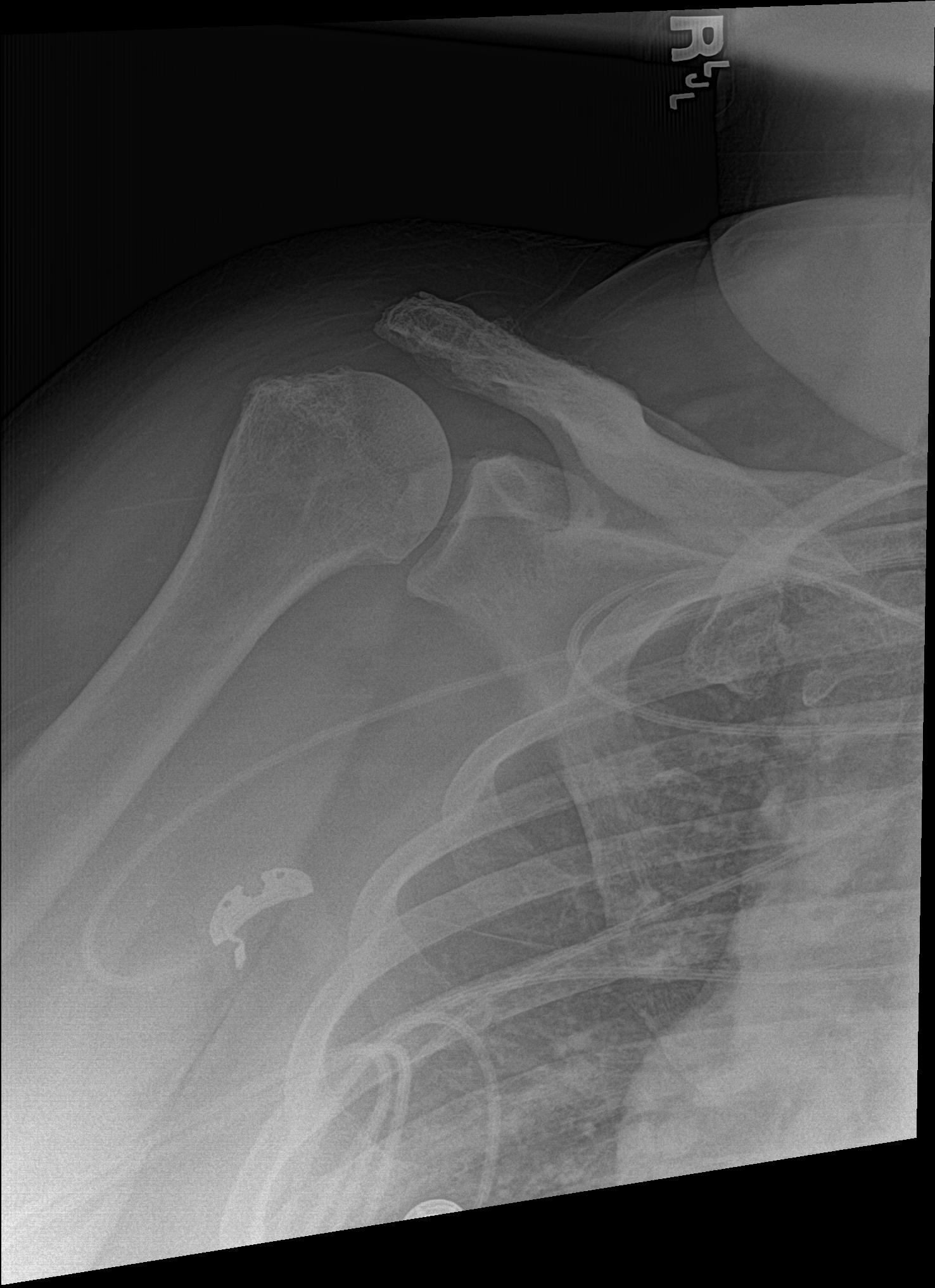
[im 2/2]
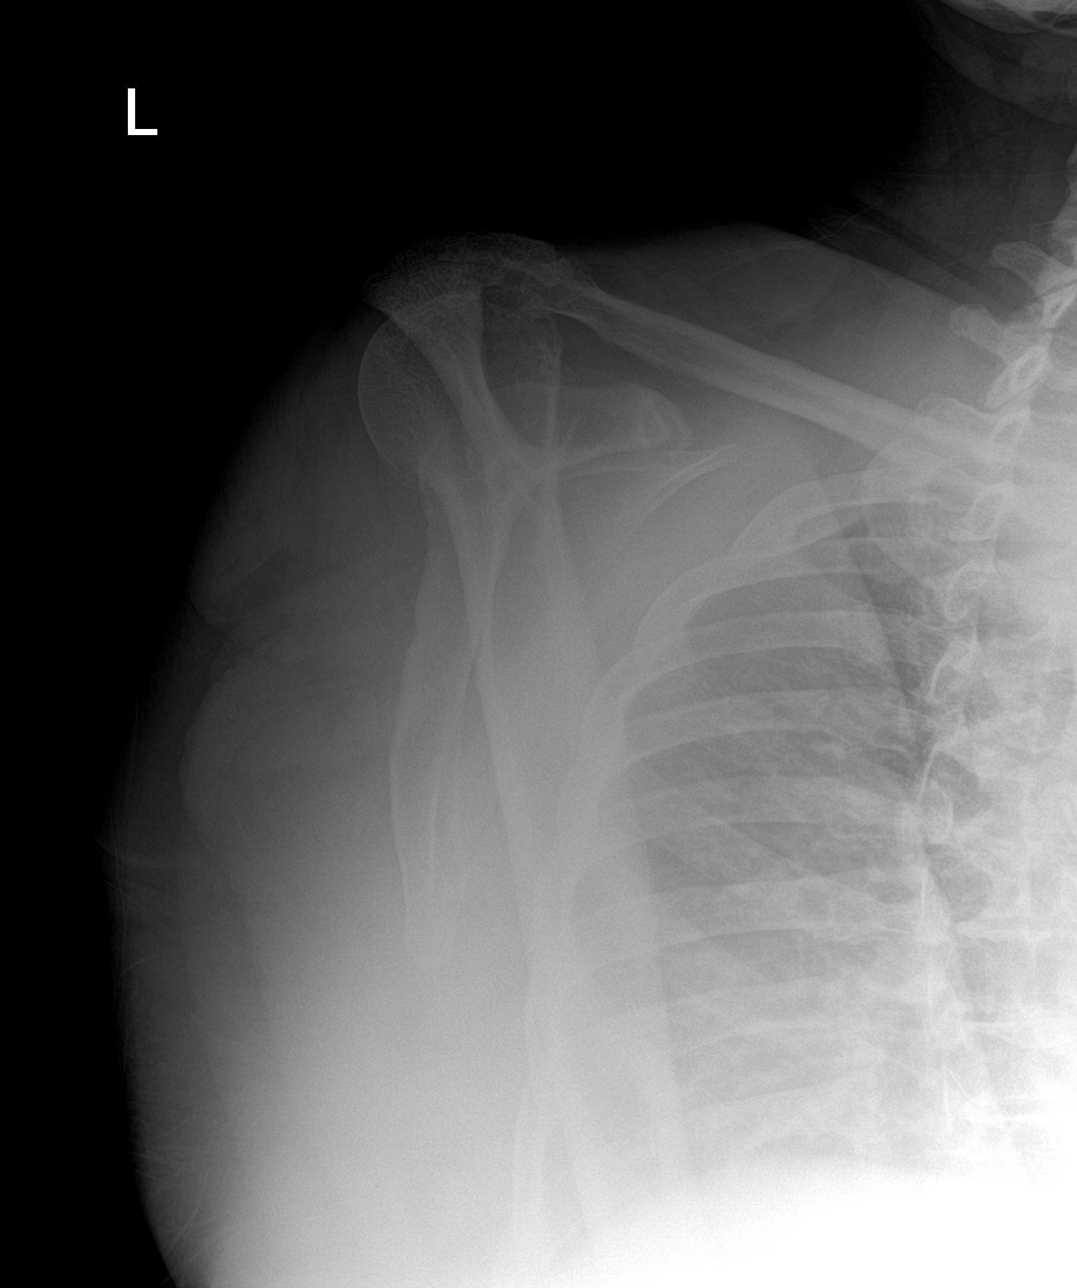

[3 of 3 positions shown; findings below may reference images not displayed]

FINDINGS: Mild acromioclavicular joint space narrowing and peripheral
osteophytosis degenerative change. Minimal inferior glenoid and
humeral head-neck junction degenerative osteophytosis.
Mild-to-moderate cystic change within the superolateral humeral
head, possibly from chronic subacromial impingement. Mild distal
lateral subacromial spurring. No acute fracture or dislocation. The
visualized portion of the right lung is unremarkable.
IMPRESSION: Mild-to-moderate acromioclavicular and mild glenohumeral
osteoarthritis.

## 2023-12-30 ENCOUNTER — Ambulatory Visit: Attending: Otolaryngology

## 2023-12-30 DIAGNOSIS — Z79899 Other long term (current) drug therapy: Secondary | ICD-10-CM | POA: Insufficient documentation

## 2023-12-30 DIAGNOSIS — I110 Hypertensive heart disease with heart failure: Secondary | ICD-10-CM | POA: Diagnosis not present

## 2023-12-30 DIAGNOSIS — I509 Heart failure, unspecified: Secondary | ICD-10-CM | POA: Insufficient documentation

## 2023-12-30 DIAGNOSIS — G4733 Obstructive sleep apnea (adult) (pediatric): Secondary | ICD-10-CM | POA: Insufficient documentation

## 2023-12-30 DIAGNOSIS — Z6841 Body Mass Index (BMI) 40.0 and over, adult: Secondary | ICD-10-CM | POA: Insufficient documentation

## 2023-12-30 DIAGNOSIS — I493 Ventricular premature depolarization: Secondary | ICD-10-CM | POA: Diagnosis not present

## 2023-12-30 DIAGNOSIS — G4736 Sleep related hypoventilation in conditions classified elsewhere: Secondary | ICD-10-CM | POA: Diagnosis not present

## 2024-08-18 ENCOUNTER — Other Ambulatory Visit: Payer: Self-pay | Admitting: Family Medicine

## 2024-08-18 DIAGNOSIS — Z1231 Encounter for screening mammogram for malignant neoplasm of breast: Secondary | ICD-10-CM

## 2024-09-27 ENCOUNTER — Encounter: Payer: Self-pay | Admitting: Radiology

## 2024-09-27 ENCOUNTER — Ambulatory Visit
Admission: RE | Admit: 2024-09-27 | Discharge: 2024-09-27 | Disposition: A | Source: Ambulatory Visit | Attending: Family Medicine | Admitting: Family Medicine

## 2024-09-27 DIAGNOSIS — Z1231 Encounter for screening mammogram for malignant neoplasm of breast: Secondary | ICD-10-CM | POA: Insufficient documentation

## 2024-09-28 ENCOUNTER — Other Ambulatory Visit: Payer: Self-pay | Admitting: Family Medicine

## 2024-09-28 DIAGNOSIS — N63 Unspecified lump in unspecified breast: Secondary | ICD-10-CM
